# Patient Record
Sex: Female | Born: 1945 | Race: White | Hispanic: No | Smoking: Former smoker
Health system: Southern US, Community
[De-identification: ages and names within clinical notes are randomized; demographics above are authoritative.]

## PROBLEM LIST (undated history)

## (undated) DIAGNOSIS — M1990 Unspecified osteoarthritis, unspecified site: Secondary | ICD-10-CM

## (undated) DIAGNOSIS — I10 Essential (primary) hypertension: Secondary | ICD-10-CM

## (undated) HISTORY — PX: APPENDECTOMY: SHX54

## (undated) HISTORY — PX: HERNIA REPAIR: SHX51

## (undated) HISTORY — PX: COLON SURGERY: SHX602

## (undated) HISTORY — PX: ABDOMINAL HYSTERECTOMY: SHX81

## (undated) HISTORY — PX: OTHER SURGICAL HISTORY: SHX169

---

## 1999-09-08 ENCOUNTER — Encounter: Admission: RE | Admit: 1999-09-08 | Discharge: 1999-09-08 | Payer: Self-pay | Admitting: Family Medicine

## 2000-06-28 ENCOUNTER — Encounter: Admission: RE | Admit: 2000-06-28 | Discharge: 2000-06-28 | Payer: Self-pay | Admitting: Family Medicine

## 2000-06-28 ENCOUNTER — Encounter: Payer: Self-pay | Admitting: Family Medicine

## 2001-07-18 ENCOUNTER — Encounter: Admission: RE | Admit: 2001-07-18 | Discharge: 2001-07-18 | Payer: Self-pay | Admitting: Family Medicine

## 2001-07-18 ENCOUNTER — Encounter: Payer: Self-pay | Admitting: Family Medicine

## 2002-07-27 ENCOUNTER — Encounter: Payer: Self-pay | Admitting: Family Medicine

## 2002-07-27 ENCOUNTER — Encounter: Admission: RE | Admit: 2002-07-27 | Discharge: 2002-07-27 | Payer: Self-pay | Admitting: Family Medicine

## 2003-08-08 ENCOUNTER — Encounter: Payer: Self-pay | Admitting: Family Medicine

## 2003-08-08 ENCOUNTER — Encounter: Admission: RE | Admit: 2003-08-08 | Discharge: 2003-08-08 | Payer: Self-pay | Admitting: Family Medicine

## 2003-09-19 ENCOUNTER — Ambulatory Visit (HOSPITAL_COMMUNITY): Admission: RE | Admit: 2003-09-19 | Discharge: 2003-09-19 | Payer: Self-pay | Admitting: Gastroenterology

## 2004-08-13 ENCOUNTER — Encounter: Admission: RE | Admit: 2004-08-13 | Discharge: 2004-08-13 | Payer: Self-pay | Admitting: Family Medicine

## 2004-10-30 ENCOUNTER — Other Ambulatory Visit: Admission: RE | Admit: 2004-10-30 | Discharge: 2004-10-30 | Payer: Self-pay | Admitting: Family Medicine

## 2004-11-27 ENCOUNTER — Encounter: Admission: RE | Admit: 2004-11-27 | Discharge: 2004-11-27 | Payer: Self-pay | Admitting: Family Medicine

## 2005-01-26 ENCOUNTER — Other Ambulatory Visit: Admission: RE | Admit: 2005-01-26 | Discharge: 2005-01-26 | Payer: Self-pay | Admitting: Obstetrics and Gynecology

## 2005-08-18 ENCOUNTER — Encounter: Admission: RE | Admit: 2005-08-18 | Discharge: 2005-08-18 | Payer: Self-pay | Admitting: Family Medicine

## 2005-12-18 ENCOUNTER — Other Ambulatory Visit: Admission: RE | Admit: 2005-12-18 | Discharge: 2005-12-18 | Payer: Self-pay | Admitting: Obstetrics and Gynecology

## 2006-06-01 ENCOUNTER — Encounter (INDEPENDENT_AMBULATORY_CARE_PROVIDER_SITE_OTHER): Payer: Self-pay | Admitting: *Deleted

## 2006-06-01 ENCOUNTER — Ambulatory Visit (HOSPITAL_COMMUNITY): Admission: RE | Admit: 2006-06-01 | Discharge: 2006-06-02 | Payer: Self-pay | Admitting: Obstetrics and Gynecology

## 2006-06-04 ENCOUNTER — Inpatient Hospital Stay (HOSPITAL_COMMUNITY): Admission: EM | Admit: 2006-06-04 | Discharge: 2006-06-11 | Payer: Self-pay | Admitting: Surgery

## 2006-06-04 ENCOUNTER — Encounter (INDEPENDENT_AMBULATORY_CARE_PROVIDER_SITE_OTHER): Payer: Self-pay | Admitting: *Deleted

## 2006-06-04 ENCOUNTER — Encounter: Payer: Self-pay | Admitting: Obstetrics and Gynecology

## 2006-08-27 ENCOUNTER — Encounter: Admission: RE | Admit: 2006-08-27 | Discharge: 2006-08-27 | Payer: Self-pay | Admitting: Family Medicine

## 2006-12-21 ENCOUNTER — Encounter: Admission: RE | Admit: 2006-12-21 | Discharge: 2006-12-21 | Payer: Self-pay | Admitting: Obstetrics & Gynecology

## 2007-02-07 ENCOUNTER — Encounter: Admission: RE | Admit: 2007-02-07 | Discharge: 2007-02-07 | Payer: Self-pay | Admitting: Family Medicine

## 2007-08-30 ENCOUNTER — Encounter: Admission: RE | Admit: 2007-08-30 | Discharge: 2007-08-30 | Payer: Self-pay | Admitting: Family Medicine

## 2008-08-31 ENCOUNTER — Encounter: Admission: RE | Admit: 2008-08-31 | Discharge: 2008-08-31 | Payer: Self-pay | Admitting: Family Medicine

## 2009-07-22 ENCOUNTER — Encounter: Admission: RE | Admit: 2009-07-22 | Discharge: 2009-07-22 | Payer: Self-pay | Admitting: Family Medicine

## 2009-08-21 ENCOUNTER — Encounter: Admission: RE | Admit: 2009-08-21 | Discharge: 2009-08-21 | Payer: Self-pay | Admitting: General Surgery

## 2009-08-26 ENCOUNTER — Ambulatory Visit (HOSPITAL_BASED_OUTPATIENT_CLINIC_OR_DEPARTMENT_OTHER): Admission: RE | Admit: 2009-08-26 | Discharge: 2009-08-26 | Payer: Self-pay | Admitting: General Surgery

## 2009-09-09 ENCOUNTER — Encounter: Admission: RE | Admit: 2009-09-09 | Discharge: 2009-09-09 | Payer: Self-pay | Admitting: Family Medicine

## 2010-09-10 ENCOUNTER — Encounter: Admission: RE | Admit: 2010-09-10 | Discharge: 2010-09-10 | Payer: Self-pay | Admitting: Family Medicine

## 2011-02-27 LAB — CBC
HCT: 40.9 % (ref 36.0–46.0)
Hemoglobin: 13.7 g/dL (ref 12.0–15.0)
MCV: 89.7 fL (ref 78.0–100.0)
RDW: 13.9 % (ref 11.5–15.5)

## 2011-02-27 LAB — DIFFERENTIAL
Lymphs Abs: 1.2 10*3/uL (ref 0.7–4.0)
Neutro Abs: 4.4 10*3/uL (ref 1.7–7.7)
Neutrophils Relative %: 71 % (ref 43–77)

## 2011-02-27 LAB — BASIC METABOLIC PANEL
Calcium: 9.4 mg/dL (ref 8.4–10.5)
Chloride: 106 mEq/L (ref 96–112)
GFR calc Af Amer: 55 mL/min — ABNORMAL LOW (ref >60)
Sodium: 141 mEq/L (ref 135–145)

## 2011-04-10 NOTE — Discharge Summary (Signed)
NAMESOMER, TROTTER                ACCOUNT NO.:  [id number]   MEDICAL RECORD NO.:  [id number]          PATIENT TYPE:  OIB   LOCATION:  9312                          FACILITY:  [facility]   PHYSICIAN:  Cynthia P. Romine, M.D.DATE OF BIRTH:  [birth year]   DATE OF ADMISSION:  06/01/2006  DATE OF DISCHARGE:  06/02/2006                                 DISCHARGE SUMMARY   DISCHARGE DIAGNOSES:  1. Leiomyoma.  2. Uterine prolapse.  3. Cystocele.   HISTORY:  This is a 65 year old, divorced, white female, gravida 3, para 2  with uterovaginal prolapse. This has been managed previously with a pessary  but the patient was very very uncomfortable with the changing of her pessary  and therefore elected definitive surgical treatment. On June 01, 2006, she  underwent a total vaginal hysterectomy. Estimated blood loss was 50 mL and  there were no complications. She also underwent a Perigee anterior repair  with mesh and a pubovaginal sling by Dr. Eudelia Bunch and likewise there were  no complications. Postoperatively, she did very well and on postoperative  day #1, she was afebrile and in satisfactory condition for discharge. She  was given a prescription for Darvocet-N 100 #25 to take 1 p.o. q.4h. p.r.n.  pain. She was to followup with me in 1 month and with Dr. Logan Bores as he  directed. Pathology report did show 2 intramural leiomyomata 0.5 and 0.7 cm.  The lab showed admission H&H was 13.9 and 41.5, on discharge 10.2 and 29.2.  Chemistries were normal.           ______________________________  Edwena Felty. Romine, M.D.     CPR/MEDQ  D:  07/19/2006  T:  07/20/2006  Job:  [id number]   cc:   Jamison Neighbor, M.D.  Fax: [phone]

## 2011-04-10 NOTE — Op Note (Signed)
   NAME:  [name], [name]                          ACCOUNT NO.:  [id number]   MEDICAL RECORD NO.:  [id number]                   PATIENT TYPE:  AMB   LOCATION:  ENDO                                 FACILITY:  [facility]   PHYSICIAN:  Graylin Shiver, M.D.                DATE OF BIRTH:  1946/01/16   DATE OF PROCEDURE:  09/19/2003  DATE OF DISCHARGE:                                 OPERATIVE REPORT   PROCEDURE:  Colonoscopy.   ENDOSCOPIST:  Graylin Shiver, M.D.   INDICATION:  Screening.   INFORMED CONSENT:  Informed consent was obtained after explanation of the  risks of bleeding, infection and perforation.   PREMEDICATIONS:  Fentanyl 70 mcg IV, Versed 7 mg IV.   PROCEDURE:  With the patient in the left lateral decubitus position, a  rectal exam was performed and no masses were felt.  The Olympus pediatric  adjustable colonoscope was inserted into the rectum and advanced around an  extremely tortuous colon to the cecum.  Cecal landmarks were identified.  The cecum and ascending colon were normal.  The transverse colon was normal.  The descending colon and sigmoid revealed diverticulosis.  The rectum was  normal.  She tolerated the procedure well without complications.   IMPRESSION:  Diverticulosis.                                               Graylin Shiver, M.D.    SFG/MEDQ  D:  09/19/2003  T:  09/19/2003  Job:  [id number]   cc:   Duncan Dull, M.D.  [address]  [place]  [place] [address]  Fax: [phone]

## 2011-04-10 NOTE — H&P (Signed)
[name], HELMING                ACCOUNT NO.:  [id number]   MEDICAL RECORD NO.:  [id number]          PATIENT TYPE:  INP   LOCATION:  2550                         FACILITY:  [facility]   PHYSICIAN:  Sandria Bales. Ezzard Standing, M.D.  DATE OF BIRTH:  [birth year]   DATE OF ADMISSION:  06/04/2006  DATE OF DISCHARGE:                                HISTORY & PHYSICAL   HISTORY OF ILLNESS:  This is a 65 year old white female who was in fairly  good health who underwent a vaginal hysterectomy and bladder tack by Dr.  Purvis Sheffield and Dr. Eudelia Bunch on 06/01/2006.  She complained of some early  pain in her right groin afterwards, was sent home though the day after her  surgery.  She gives a history of having a right femoral hernia which she has  not had surgerically treated before nor has she been made aware to Dr.  Tresa Res and Dr. Logan Bores.   She presented to [facility] today with acute onset of increasing lower  abdominal pain and a CT scan reviewed revealed a pneumoperitoneum.   PRIOR GASTROINTESTINAL HISTORY:  She had a perforated ulcer in 1985 which  required a laparotomy.  She has had no history of liver disease, pancreas  disease or colon disease.  She has even a more remote history of an  appendectomy in 1948.  She was having her hysterectomy because of prolapse  of her uterus.   PAST MEDICAL HISTORY:  She is ALLERGIC TO CODEINE.   MEDICATIONS:  Include atenolol, Cardura, doxycycline.   REVIEW OF SYSTEMS:  NEUROLOGIC:  No seizure or loss of consciousness.  CARDIAC:  She has had hypertension for a number of years but no cardiac  catheterization or chest pain.  PULMONARY:  No history of pneumonia or tuberculosis.  GASTROINTESTINAL:  See history of present illness.  UROLOGIC:  No kidney infections. She still had the foley from Dr. Logan Bores  bladder tacking surgery.   She is divorced.  She has two sons, Whitney Post and Weston Brass.   PHYSICAL EXAMINATION:  Temperature is 97.7, pulse 79, blood pressure  150/70,  sats are 97%.  She is a well-nourished, somewhat pale, uncomfortable white  female.  HEENT:  Unremarkable.  NECK:  Supple, I feel no mass, no thyromegaly.  LUNGS:  Clear to auscultation.  HEART:  Regular rate and rhythm.  ABDOMEN:  Diffusely tender.  In a totally supine position, I may be can  appreciate the right inguinal hernia.  She has a bruise in her pubic area  from her recent surgery.  She has a well-healed upper midline incision, a  well-healed right paramedian incision.  I did not do a pelvic exam on her.   LABORATORY:  White blood count of 13,500, hemoglobin 12, hematocrit 37.  I reviewed her abdominal CT scan with Arbie Cookey and at first he was actually  more worried about this being possibly origin originating from the stomach  in the way the contrast is and the free air.  He also identified a right  renal cyst which appears benign, about a 2-cm left adrenal mass which  would  be an incidental finding and a right inguinal hernia where it is a little  bit hard to tell whether she has bowel in that hernia or not, and it looks  more like a femoral hernia.   DIAGNOSES:  1.  Pneumoperitoneum after recent surgery with a differential diagnosis      being injury secondary to prior surgery versus ulcer disease versus      diverticular disease.  I talked with the patient and her son Whitney Post.  I      think she is best to proceed with an exploratory laparotomy.  I      discussed with them the indications and potential risks of surgery.      Potential risks include but are not limited to bleeding, infection which      I think she already has, the need for bowel resection and the      possibility of a colostomy.  I spoke to both Drs. Romine and Evans about      my plan.  2.  History  of ulcer disease.  I do not know how this plays a role in this      current finding.  3.  Right femoral hernia.  Again, I am not sure how this plays a role in      this current finding.  4.  Right  renal cyst.  5.  Left adrenal mass.  6.  Hypertension.      Sandria Bales. Ezzard Standing, M.D.  Electronically Signed     DHN/MEDQ  D:  06/04/2006  T:  06/04/2006  Job:  [id number]   cc:   Duncan Dull, M.D.  Fax: [phone]   Edwena Felty. Romine, M.D.  Fax: [phone]   Jamison Neighbor, M.D.  Fax: [phone]

## 2011-04-10 NOTE — Op Note (Signed)
[name], [name]                ACCOUNT NO.:  [id number]   MEDICAL RECORD NO.:  [id number]          PATIENT TYPE:  AMB   LOCATION:  SDC                           [facility]   PHYSICIAN:  Cynthia P. Romine, M.D.DATE OF BIRTH:  06-06-1946   DATE OF PROCEDURE:  06/01/2006  DATE OF DISCHARGE:                                 OPERATIVE REPORT   PREOPERATIVE DIAGNOSIS:  Uterovaginal prolapse.   POSTOPERATIVE DIAGNOSIS:  Uterovaginal prolapse, path pending.   PROCEDURE:  Total vaginal hysterectomy.   SURGEON:  Dr. Arline Asp Romine.   ASSISTANT:  Dr. Leda Quail.   ANESTHESIA:  General by LMA.   ESTIMATED BLOOD LOSS:  50 mL.   COMPLICATIONS:  None.   PROCEDURE:  The patient was taken to the operating room and after induction  of adequate general anesthesia was placed in dorsal lithotomy position and  prepped and draped in usual fashion.  Her pessary was removed.  The bladder  was drained with a red rubber catheter.  The vagina was reprepped after  removing the pessary.  A posterior weighted and anterior Deaver were placed.  The cervix was grasped at the anterior lip with a straight Jacobs tenaculum.  The mucosa over the cervix was infiltrated with 3 mL of 1% Xylocaine with  epinephrine.  The mucosa was incised with a knife from 9 to 3 o'clock and  taken down sharply off the cervix.  Attention was next turned posteriorly.  A posterior colpotomy incision was made and the banana retractor was placed.  Uterosacral ligaments were clamped, cut and tied with 0 Vicryl.  The  cardinal ligaments on each side were clamped, cut and tied again with 0  Vicryl.  Attention was then turned anteriorly.  The peritoneum was elevated  and entered atraumatically.  The Deaver was placed in the peritoneal space.  The pedicle containing the uterine arteries were clamped, cut and doubly  tied on both sides.  The fundus and delivered to the posterior cul-de-sac.  The pedicle containing the tube,  utero-ovarian and round, was clamped, cut  and doubly tied on both sides.  The specimen was sent to pathology.  The  ovaries were inspected and felt to be normal as were the tubes.  The  pedicles were hemostatic and they were cut, allowing the ovaries to retract.  The vaginal cuff was run with 0 Vicryl with a locking suture from 1 o'clock  to 11 o'clock for hemostasis.  An enterocele stitch was placed by going into  the posterior peritoneum to the vaginal cuff suturing the left uterosacral  ligament __________ across the peritoneum between the right uterosacral  ligament coming out in the midline and tying the suture.  The wound was then  irrigated and inspected and found to be  hemostatic.  The vaginal cuff was closed with three sutures of figure-of-  eight 0 Vicryl.  The cuff was then irrigated again and the instruments were  removed from the vagina and the procedure was terminated.  Dr. Logan Bores then  came in to do the anterior repair and he will dictate that separately.  ______________________________  Edwena Felty. Romine, M.D.     CPR/MEDQ  D:  06/01/2006  T:  06/01/2006  Job:  [id number]

## 2011-04-10 NOTE — Op Note (Signed)
[name], [name]                ACCOUNT NO.:  [id number]   MEDICAL RECORD NO.:  [id number]          PATIENT TYPE:  OIB   LOCATION:  9312                          [facility]   PHYSICIAN:  Jamison Neighbor, M.D.  DATE OF BIRTH:  January 17, 1946   DATE OF PROCEDURE:  06/01/2006  DATE OF DISCHARGE:  06/02/2006                                 OPERATIVE REPORT   PREOPERATIVE DIAGNOSES:  1. Cystocele.  2. Stress urinary incontinence.   PROCEDURE:  Perigee anterior repair with mesh, and also a pubovaginal sling.   SURGEON:  Jamison Neighbor, M.D.   ANESTHESIA:  General.   COMPLICATIONS:  None.   DRAINS:  Foley catheter.   BRIEF HISTORY:  This 65 year old female has known vaginal prolapse as well  as a cystocele and associated stress incontinence.  The patient underwent a  vaginal hysterectomy by Dr. Meredeth Gallagher with the assistance of Dr.  Leda Gallagher, and the uterus has been removed.  The patient had very  minimal blood loss and appeared to have done very nicely with that  procedure.  She is now to undergo repair of her stress incontinence along  with mesh placement for repair of her cystocele.  She understands the risks  and benefits of the procedure and gave full informed consent.   PROCEDURE:  The patient was found in the operating room with the area fully  prepped, having completed her vaginal hysterectomy.  The incision was  extended from the midportion of the urethra all the way down to the  operative site where the hysterectomy had been performed.  Flaps were raised  bilaterally, exposing the cystocele.  This went all the way back to but not  through the endopelvic fascia.  The cystocele was plicated in the midline,  pulling the cervical fascia across to elevate the cystocele in preparation  for placement of the Perigee sling.  The patient had two puncture holes  placed on each side.  One was at the approximate level of the clitoris in  the groin crease, and the other  was approximately 3.5 cm inferiorly.  A  curved needle was then passed from the incision into the vaginal incision on  both sides for a total of 4 needles.  The 4 arms of the Perigee sling were  then pulled through after cystoscopy to confirm that none of the needles had  penetrated the bladder.  Surgical confirmation was performed to make sure  that none of the needles had penetrated the lateral sulcus.  The mesh graft  was then placed in the appropriate position and trimmed in size.  It was  used to elevate the cystocele very nicely, and a good neutral position was  obtained.  The redundant vaginal mucosa was partially trimmed.  A sling was  then placed around the mid-urethra, passing down from 2 small stab incisions  superiorly, around the urethra and up to the top.  The position the sling  was confirmed to be correct by cystoscopy, which did confirm there was no  injury to the urethra.  The sling tension was then  set so that there was no  overcorrection.  This was done by placing a Mayo scissors between the  urethra and the sling.  All 6 arms of the mesh were then trimmed off at the  skin level.  The incision was closed with a running suture of 2-0 Vicryl  after adequate irrigation.  Packing was placed.  The patient had a Foley  catheter  inserted.  She tolerated the procedure well and was taken to the recovery  room in good condition.  She will have overnight observation due to the fact  that she has had a hysterectomy as well.  The patient tolerated the  procedure well and was taken to the recovery room in good condition.           ______________________________  Jamison Neighbor, M.D.  Electronically Signed     RJE/MEDQ  D:  07/10/2006  T:  07/11/2006  Job:  [id number]   cc:   Aram Beecham P. Romine, M.D.  Fax: [phone]

## 2011-04-10 NOTE — Op Note (Signed)
NAMEQUINETTE, HENTGES                ACCOUNT NO.:  [id number]   MEDICAL RECORD NO.:  [id number]          PATIENT TYPE:  INP   LOCATION:  5735                         FACILITY:  [facility]   PHYSICIAN:  Sandria Bales. Ezzard Standing, M.D.  DATE OF BIRTH:  21-May-1946   DATE OF PROCEDURE:  06/04/2006  DATE OF DISCHARGE:                                 OPERATIVE REPORT   PREOPERATIVE DIAGNOSIS:  Pneumoperitoneum.  Right inguinal/femoral hernia.  Recent pelvic/bladder surgery.   POSTOPERATIVE DIAGNOSIS:  Pneumoperitoneum secondary to perforated posterior  pyloric channel ulcer, right femoral hernia, no evidence of bowel injury  from recent pelvic/bladder surgery.   PROCEDURE:  [name] repair of perforated posterior pyloric channel  ulcer and pursestring repair of right femoral hernia.   SURGEON:  Sandria Bales. Ezzard Standing, M.D.   ASSISTANT:  Jamison Neighbor, M.D.   ANESTHESIA:  General endotracheal.   ESTIMATED BLOOD LOSS:  100 mL.   DRAINS:  None.  I did place Telfa wicks in the wound.   INDICATIONS:  Miss Komatsu is a 65 year old white female who is a patient of  Dr. Shaune Pollack who presented 3 days after vaginal hysterectomy and bladder  tacked by Dr. Salena Saner. Romine and Dr. Logan Bores, with acute onset of abdominal pain.  She initially presented to [facility] where CT scan showed  pneumoperitoneum.  She was transferred to the [facility] for management  of his pneumoperitoneum.   The indications, procedure complications and procedure explained to the  patient and her son by phone.  Her pneumoperitoneum is possibly related to  recent surgery versus remote history of peptic ulcer disease versus  diverticular disease.  She referred for bowel resection and colostomy and  possibly I am not going to be able to close her repaired hernia.   DESCRIPTION OF PROCEDURE:  The patient in the supine position given 100 mg  of Tygacil at the beginning of the operation.  She was already on  doxycycline.  She had a Foley  catheter ws already in place, PAS stockings in  place.  Her abdomen is prepped with Betadine solution and sterilely draped.   Sharp dissection was carried down to the abdominal cavity first through an  infraumbilical incision.  She had a prior upper midline incision for prior  perforate ulcer and a prior right paramedian incision from an appendectomy.  The patient clearly had some contamination in her belly.  She had a hernia,  I think a right femoral hernia but mainly it looks like she had omentum in  this hernia, not bowel.  The sigmoid colon was examined. There was no  evidence of injury.  The cecum was examined, there was no evidence of  injury.  The small-bowel was run back to the terminal ileum towards the  ligament of Treitz and there is no ev of small-bowel injury.   On looking higher in the abdomen, I took down the prior adhesions to her  midline incision or her appendectomy incision, and it was clear that the  source was upper abdomen therefore I extended her incision above her  umbilicus and exposed  her stomach, and on the posterior wall of the stomach,  right up the pylorus was an approximately 1 cm perforated pyloric channel  ulcer. I took down the adhesions around this and cleaned this up and then I  placed 3 silk sutures to tie these down as a primary repair and then did an  omental patch over this.   I then irrigated the abdomen with about 6 liters of saline trying to make  sure all quadrants were clear.   I did turn my attention to the right femoral hernia where I reduced this  omentum which was incarcerated in the hernia.  I then made a pursestring  with #1 Novafil suture which closed this off which I think prevented  anything else from slipping in this, so I am not really sure this is a very  good hernia repair.   The abdomen was again then closed with 2 running #1 PDS sutures with some  interrupted #1 Novafil sutures.  The skin was closed with Telfa placed in  the  wound.   The patient was transferred to the recovery room.  We plan to keep her in  the ICU overnight.  Her sponge and needle count were correct at the end of  the case.  She tolerated the procedure well.  Had minimal blood loss  probably less than 100 mL.  Sponge and needle count were correct.      Sandria Bales. Ezzard Standing, M.D.  Electronically Signed     DHN/MEDQ  D:  06/04/2006  T:  06/05/2006  Job:  [id number]   cc:   Jamison Neighbor, M.D.  Fax: [phone]   Duncan Dull, M.D.  Fax: [phone]   Edwena Felty. Romine, M.D.  Fax: [phone]

## 2011-04-10 NOTE — Discharge Summary (Signed)
[name], [name]                ACCOUNT NO.:  [id number]   MEDICAL RECORD NO.:  [id number]          PATIENT TYPE:  INP   LOCATION:  5735                         FACILITY:  [facility]   PHYSICIAN:  Leonie Gallagher, M.D.   DATE OF BIRTH:  1945-12-29   DATE OF ADMISSION:  06/04/2006  DATE OF DISCHARGE:  06/11/2006                                 DISCHARGE SUMMARY   CHIEF COMPLAINT/REASON FOR ADMISSION:  [name] is a 65 year old female  patient normally in good health who underwent vaginal hysterectomy and  bladder tack at [name] on June 01, 2006.  She complained of pain  in the right groin after the procedure and was sent home the day after her  surgery.  She reported to Dr. Ezzard Gallagher a history of right femoral hernia,  which she has not had surgically treated nor had she made the operative  physicians at [name] aware of this problem as well.  She presented  back to [name] with complaints of acute increasing lower abdominal  pain.  A CT scan done there showed a pneumoperitoneum.   On exam, patient's vital signs were stable.  She was mildly hypertensive.  Her abdomen was diffusely tender.  Dr. Ezzard Gallagher felt he could, maybe, feel a  right inguinal hernia, but was difficult due to swelling and bruising from  recent surgery.  Her midline incision was well healed.  Pelvic exam was  deferred.  Her white blood cell count was 13,500, hemoglobin 12, hematocrit  37.  The CT scan was reviewed with the radiologist by Dr. Ezzard Gallagher.  There  were several nonspecific findings that would not be causing the patient's  pain.  These are noted on the history and physical under laboratory.  No  definite point on CT to define where the pneumoperitoneum was arriving from.  The differential diagnosis was injury secondary to first surgery versus  ulcer disease, versus diverticular disease.  Based on these findings, Dr.  Ezzard Gallagher felt that the patient needed to be admitted and undergo  exploratory  laparotomy to determine the etiology of the pneumoperitoneum.   ADMISSION DIAGNOSES:  1. Pneumoperitoneum.  2. History of ulcer disease.  3. Right femoral hernia.  4. Right renal cyst.  5. Left adrenal mass.  6. Hypertension.   Gallagher COURSE:  The patient was taken urgently to the operating room by  Dr. Ezzard Gallagher where she was found to have a perforated pyloric channel ulcer.  Also right femoral hernia.  There was no noted bowel injury from prior  surgery.  Patient underwent a [name] patch placement to the perforated ulcer  and a pursestring repair to the right femoral hernia.  This was done under  general anesthesia.  The patient was taken to the PACU for recovery in the  initial postoperative period and then later back to the general floor.  Gallagher course over the next few days, the patient slowly progressed.  Her  white count was down to 10,500 postoperative day #1, hemoglobin was stable  at 11.  Potassium slightly low at 3.3.  Patient had issues related to  nausea.  Her NG tube remained in place.  Sodium, on postop day #2 was 130,  white count back up to 12,000.  Abdomen exam was benign.   Throughout the remainder of the hospitalization, patient's main troubles  related to nausea and vomiting, it appeared that the NG had not been placed  appropriately so the NG was repositioned.  The patient continued without  flatus for several days, but NG output improved after repositioning of the  NG tube.  By postop day #5 patient was complaining of belching and flatus.  Her white count was normal at 9,600, hemoglobin 10.8.  She had decreased  incisional pain and wanted her NG tube out.  Her NG tube was clamped with  plans to discontinue later in the day if she tolerated without nausea,  vomiting, or abdominal distention.  The NG tube was discontinued on postop  day #5 and, by postop day #6, patient was started on clear liquid diet.  Postop day #7, patient remained stable, was  tolerating a diet advance.  During the first surgery, with the bladder tacking, a Foley catheter had  been placed.  Urology came by prior to discharge to evaluate the patient,  discontinued the Foley, and patient was taught I and O catheterization and  was deemed appropriate, from their standpoint, for discharge home.  From our  standpoint, she was also stable for discharge home with the plan to follow  up with the operative surgeon, Dr. Ezzard Gallagher, in 2 weeks.   FINAL DISCHARGE DIAGNOSES:  1. Perforated duodenum ulcer status post [name] patch.  2. Right inguinal hernia status post pursestring repair.  3. Recent vaginal hysterectomy and bladder tacking, surgery by Dr. Purvis Sheffield and Dr. Eudelia Bunch on June 01, 2006.  4. Leukocytosis, resolved.  5. Mild anemia.  6. Hypokalemia, resolved.   DISCHARGE MEDICATIONS:  1. The patient will resume any preoperative medications she was taking.  2. [name] p.r.n. for pain.   DIET:  Bland diet.   ACTIVITY:  No lifting for 6 weeks.  May shower.   FOLLOWUP:  She needs to call Dr. Allene Gallagher office to schedule an appointment  for the next few weeks.      [name], N.P.      Leonie Gallagher, M.D.  Electronically Signed    ALE/MEDQ  D:  07/09/2006  T:  07/10/2006  Job:  [id number]   cc:   [name], M.D.  [name], M.D.  [name], M.D.  [name], M.D.

## 2011-07-29 ENCOUNTER — Other Ambulatory Visit: Payer: Self-pay | Admitting: Dermatology

## 2011-08-06 ENCOUNTER — Encounter (HOSPITAL_BASED_OUTPATIENT_CLINIC_OR_DEPARTMENT_OTHER)
Admission: RE | Admit: 2011-08-06 | Discharge: 2011-08-06 | Disposition: A | Discharge status: Home / Self Care | Payer: Medicare Other | Source: Ambulatory Visit | Attending: Orthopedic Surgery | Admitting: Orthopedic Surgery

## 2011-08-06 LAB — BASIC METABOLIC PANEL
CO2: 26 mEq/L (ref 19–32)
Calcium: 9.4 mg/dL (ref 8.4–10.5)
Creatinine, Ser: 1.12 mg/dL — ABNORMAL HIGH (ref 0.50–1.10)
GFR calc Af Amer: 59 mL/min — ABNORMAL LOW (ref >60)
GFR calc non Af Amer: 49 mL/min — ABNORMAL LOW (ref >60)
Glucose, Bld: 100 mg/dL — ABNORMAL HIGH (ref 70–99)
Sodium: 135 mEq/L (ref 135–145)

## 2011-08-07 ENCOUNTER — Ambulatory Visit (HOSPITAL_BASED_OUTPATIENT_CLINIC_OR_DEPARTMENT_OTHER)
Admission: RE | Admit: 2011-08-07 | Discharge: 2011-08-07 | Disposition: A | Discharge status: Home / Self Care | Payer: Medicare Other | Source: Ambulatory Visit | Attending: Orthopedic Surgery | Admitting: Orthopedic Surgery

## 2011-08-07 DIAGNOSIS — Z01810 Encounter for preprocedural cardiovascular examination: Secondary | ICD-10-CM | POA: Insufficient documentation

## 2011-08-07 DIAGNOSIS — M6530 Trigger finger, unspecified finger: Secondary | ICD-10-CM | POA: Insufficient documentation

## 2011-08-07 DIAGNOSIS — M65849 Other synovitis and tenosynovitis, unspecified hand: Secondary | ICD-10-CM | POA: Insufficient documentation

## 2011-08-07 DIAGNOSIS — G5600 Carpal tunnel syndrome, unspecified upper limb: Secondary | ICD-10-CM | POA: Insufficient documentation

## 2011-08-07 DIAGNOSIS — M65839 Other synovitis and tenosynovitis, unspecified forearm: Secondary | ICD-10-CM | POA: Insufficient documentation

## 2011-08-07 DIAGNOSIS — Z01812 Encounter for preprocedural laboratory examination: Secondary | ICD-10-CM | POA: Insufficient documentation

## 2011-08-07 LAB — POCT HEMOGLOBIN-HEMACUE: Hemoglobin: 13 g/dL (ref 12.0–15.0)

## 2011-08-13 ENCOUNTER — Other Ambulatory Visit: Payer: Self-pay | Admitting: Family Medicine

## 2011-08-13 DIAGNOSIS — Z1231 Encounter for screening mammogram for malignant neoplasm of breast: Secondary | ICD-10-CM

## 2011-08-13 NOTE — Op Note (Signed)
NAMEJESSAMY, [name]                ACCOUNT NO.:  [id number]  MEDICAL RECORD NO.:  [id number]  LOCATION:                                 FACILITY:  PHYSICIAN:  Lisa Fitch. Kayliana Codd, M.D.      DATE OF BIRTH:  DATE OF PROCEDURE:  08/07/2011 DATE OF DISCHARGE:                              OPERATIVE REPORT   PREOPERATIVE DIAGNOSES: 1. Entrapment neuropathy, median nerve, right carpal tunnel. 2. 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  POSTOPERATIVE DIAGNOSES: 1. Entrapment neuropathy, median nerve, right carpal tunnel. 2. 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  OPERATION: 1. Release of right thumb A1 pulley. 2. Release of right transcarpal ligament.  OPERATING SURGEON:  Lisa Fitch. Sajjad Honea, MD.  ASSISTANT:  Marveen Reeks Dasnoit, PA.  ANESTHESIA:  General by LMA.  SUPERVISING ANESTHESIOLOGIST:  Zenon Mayo, MD.  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  Clinical examination revealed signs of bilateral carpal tunnel syndrome and stenosing tenosynovitis involving the right thumb with a swollen A1 pulley that was markedly tender on direct palpation.  We advised her to proceed with release of the A1 pulley and after electrodiagnostic studies confirmed significant median neuropathy, release of the right transverse carpal ligament.  Questions regarding the surgery were invited and answered in detail.  DESCRIPTION OF PROCEDURE:  [name] was brought to room #1 of the [facility] and placed in supine position on the operating table.  Following induction of general anesthesia by LMA technique under Dr. Jarrett Ables direct supervision, the right arm was prepped with Betadine soap solution and sterilely draped.  Following routine surgical time- out, the right arm and hand were exsanguinated with an Esmarch  bandage and an arterial tourniquet on the proximal right brachium inflated to 220 mmHg.  Procedure commenced with short transverse incision directly over the thumb A1 pulley.  Subcutaneous tissues were carefully divided taking care to gently retract neurovascular structures.  The pulley was split with scalpel and scissors.  This freed the tendon.  The tendon was noted to have some mild areas of necrosis due to chronic pressure.  This was debrided with a rongeur.  The wound was then repaired with intradermal 3-0 Prolene suture.  Attention was then directed to the proximal palm.  A short incision was fashioned in the line of the ring finger.  Subcutaneous tissues were carefully divided in the middle palmar fascia.  This was split longitudinally to the common sensory branches of the median nerve.  These were followed back to the median nerve proper which was isolated from the transverse carpal ligament with a Insurance risk surveyor.  Once a pathway was cleared superficial and deep to the transverse carpal ligament, the ligament was released with scissors into the distal forearm.  This widely opened carpal canal.  No mass or other predicaments were noted.  Bleeding points along the margin of the released ligament were electrocauterized with bipolar current.  The wound was repaired with intradermal 3-0 Prolene suture.  2% lidocaine was infiltrated for postop analgesia.  She will return  for follow up in 7-9 days.  Questions invited and answered in detail.     [name], M.D.     RVS/MEDQ  D:  08/07/2011  T:  08/07/2011  Job:  [id number]  cc:   Duncan Dull, M.D.  Electronically Signed by Josephine Igo M.D. on 08/13/2011 11:39:11 AM

## 2011-09-14 ENCOUNTER — Ambulatory Visit
Admission: RE | Admit: 2011-09-14 | Discharge: 2011-09-14 | Disposition: A | Discharge status: Home / Self Care | Payer: Medicare Other | Source: Ambulatory Visit | Attending: Family Medicine | Admitting: Family Medicine

## 2011-09-14 DIAGNOSIS — Z1231 Encounter for screening mammogram for malignant neoplasm of breast: Secondary | ICD-10-CM

## 2011-09-18 ENCOUNTER — Other Ambulatory Visit: Payer: Self-pay | Admitting: Family Medicine

## 2011-09-18 DIAGNOSIS — R928 Other abnormal and inconclusive findings on diagnostic imaging of breast: Secondary | ICD-10-CM

## 2011-09-24 ENCOUNTER — Encounter (HOSPITAL_BASED_OUTPATIENT_CLINIC_OR_DEPARTMENT_OTHER): Payer: Self-pay | Admitting: *Deleted

## 2011-09-24 NOTE — Progress Notes (Signed)
Dr. Gelene Mink reviewed EKG and labs from Sept 14 th visit here - OK for surgery does not need more labs

## 2011-09-29 ENCOUNTER — Encounter (HOSPITAL_BASED_OUTPATIENT_CLINIC_OR_DEPARTMENT_OTHER)
Admission: RE | Disposition: A | Discharge status: Home / Self Care | Payer: Self-pay | Source: Ambulatory Visit | Attending: Orthopedic Surgery

## 2011-09-29 ENCOUNTER — Ambulatory Visit (HOSPITAL_BASED_OUTPATIENT_CLINIC_OR_DEPARTMENT_OTHER)
Admission: RE | Admit: 2011-09-29 | Discharge: 2011-09-29 | Disposition: A | Discharge status: Home / Self Care | Payer: Medicare Other | Source: Ambulatory Visit | Attending: Orthopedic Surgery | Admitting: Orthopedic Surgery

## 2011-09-29 ENCOUNTER — Encounter (HOSPITAL_BASED_OUTPATIENT_CLINIC_OR_DEPARTMENT_OTHER): Payer: Self-pay | Admitting: *Deleted

## 2011-09-29 ENCOUNTER — Ambulatory Visit (HOSPITAL_BASED_OUTPATIENT_CLINIC_OR_DEPARTMENT_OTHER): Payer: Medicare Other | Admitting: *Deleted

## 2011-09-29 DIAGNOSIS — Z01812 Encounter for preprocedural laboratory examination: Secondary | ICD-10-CM | POA: Insufficient documentation

## 2011-09-29 DIAGNOSIS — G5600 Carpal tunnel syndrome, unspecified upper limb: Secondary | ICD-10-CM | POA: Insufficient documentation

## 2011-09-29 DIAGNOSIS — M65839 Other synovitis and tenosynovitis, unspecified forearm: Secondary | ICD-10-CM | POA: Insufficient documentation

## 2011-09-29 DIAGNOSIS — I10 Essential (primary) hypertension: Secondary | ICD-10-CM | POA: Insufficient documentation

## 2011-09-29 DIAGNOSIS — M6530 Trigger finger, unspecified finger: Secondary | ICD-10-CM | POA: Insufficient documentation

## 2011-09-29 HISTORY — PX: TRIGGER FINGER RELEASE: SHX641

## 2011-09-29 HISTORY — PX: CARPAL TUNNEL RELEASE: SHX101

## 2011-09-29 HISTORY — DX: Essential (primary) hypertension: I10

## 2011-09-29 LAB — POCT HEMOGLOBIN-HEMACUE: Hemoglobin: 13.3 g/dL (ref 12.0–15.0)

## 2011-09-29 SURGERY — CARPAL TUNNEL RELEASE
Anesthesia: Choice | Site: Wrist | Laterality: Left

## 2011-09-29 MED ORDER — LACTATED RINGERS IV SOLN: INTRAVENOUS | Status: DC

## 2011-09-29 MED ORDER — MIDAZOLAM HCL 5 MG/5ML IJ SOLN
INTRAMUSCULAR | Status: DC | PRN
  Administered 2011-09-29: 0.5 mg via INTRAVENOUS

## 2011-09-29 MED ORDER — ONDANSETRON HCL 4 MG/2ML IJ SOLN
INTRAMUSCULAR | Status: DC | PRN
  Administered 2011-09-29: 4 mg via INTRAVENOUS

## 2011-09-29 MED ORDER — DEXAMETHASONE SODIUM PHOSPHATE 4 MG/ML IJ SOLN
INTRAMUSCULAR | Status: DC | PRN
  Administered 2011-09-29: 10 mg via INTRAVENOUS

## 2011-09-29 MED ORDER — LACTATED RINGERS IV SOLN
INTRAVENOUS | Status: DC | PRN
  Administered 2011-09-29 (×2): via INTRAVENOUS

## 2011-09-29 MED ORDER — LIDOCAINE HCL 2 % IJ SOLN
INTRAMUSCULAR | Status: DC | PRN
  Administered 2011-09-29: 7 mL

## 2011-09-29 MED ORDER — FENTANYL CITRATE 0.05 MG/ML IJ SOLN: 25.0000 ug | INTRAMUSCULAR | Status: DC | PRN

## 2011-09-29 MED ORDER — LIDOCAINE HCL (CARDIAC) 20 MG/ML IV SOLN
INTRAVENOUS | Status: DC | PRN
  Administered 2011-09-29: 60 mg via INTRAVENOUS

## 2011-09-29 MED ORDER — PROMETHAZINE HCL 25 MG/ML IJ SOLN: 6.2500 mg | INTRAMUSCULAR | Status: DC | PRN

## 2011-09-29 MED ORDER — FENTANYL CITRATE 0.05 MG/ML IJ SOLN
INTRAMUSCULAR | Status: DC | PRN
  Administered 2011-09-29: 50 ug via INTRAVENOUS

## 2011-09-29 MED ORDER — PROPOFOL 10 MG/ML IV EMUL
INTRAVENOUS | Status: DC | PRN
  Administered 2011-09-29: 140 mg via INTRAVENOUS

## 2011-09-29 SURGICAL SUPPLY — 50 items
BANDAGE ADHESIVE 1X3 (GAUZE/BANDAGES/DRESSINGS) IMPLANT
BANDAGE ELASTIC 3 VELCRO ST LF (GAUZE/BANDAGES/DRESSINGS) ×3 IMPLANT
BLADE SURG 15 STRL LF DISP TIS (BLADE) ×2 IMPLANT
BLADE SURG 15 STRL SS (BLADE) ×3
BNDG CMPR 9X4 STRL LF SNTH (GAUZE/BANDAGES/DRESSINGS)
BNDG CMPR MD 5X2 ELC HKLP STRL (GAUZE/BANDAGES/DRESSINGS) ×2
BNDG ELASTIC 2 VLCR STRL LF (GAUZE/BANDAGES/DRESSINGS) ×3 IMPLANT
BNDG ESMARK 4X9 LF (GAUZE/BANDAGES/DRESSINGS) IMPLANT
BRUSH SCRUB EZ PLAIN DRY (MISCELLANEOUS) ×3 IMPLANT
CLOTH BEACON ORANGE TIMEOUT ST (SAFETY) ×3 IMPLANT
CORDS BIPOLAR (ELECTRODE) ×3 IMPLANT
COVER MAYO STAND STRL (DRAPES) ×3 IMPLANT
COVER TABLE BACK 60X90 (DRAPES) ×3 IMPLANT
CUFF TOURNIQUET SINGLE 18IN (TOURNIQUET CUFF) ×3 IMPLANT
DECANTER SPIKE VIAL GLASS SM (MISCELLANEOUS) IMPLANT
DRAPE EXTREMITY T 121X128X90 (DRAPE) ×3 IMPLANT
DRAPE SURG 17X23 STRL (DRAPES) ×3 IMPLANT
DRSG XEROFORM 1X8 (GAUZE/BANDAGES/DRESSINGS) ×3 IMPLANT
GAUZE SPONGE 4X4 12PLY STRL LF (GAUZE/BANDAGES/DRESSINGS) ×6 IMPLANT
GAUZE XEROFORM 1X8 LF (GAUZE/BANDAGES/DRESSINGS) ×3 IMPLANT
GLOVE BIO SURGEON STRL SZ 6.5 (GLOVE) ×3 IMPLANT
GLOVE BIO SURGEON STRL SZ7 (GLOVE) ×3 IMPLANT
GLOVE BIOGEL M STRL SZ7.5 (GLOVE) ×3 IMPLANT
GLOVE ORTHO TXT STRL SZ7.5 (GLOVE) ×3 IMPLANT
GOWN PREVENTION PLUS XLARGE (GOWN DISPOSABLE) ×3 IMPLANT
GOWN PREVENTION PLUS XXLARGE (GOWN DISPOSABLE) ×6 IMPLANT
GOWN STRL REIN XL XLG (GOWN DISPOSABLE) ×6 IMPLANT
LABEL LIDOCAINE (MISCELLANEOUS) ×3 IMPLANT
NEEDLE 27GAX1X1/2 (NEEDLE) IMPLANT
PACK BASIN DAY SURGERY FS (CUSTOM PROCEDURE TRAY) ×3 IMPLANT
PAD CAST 3X4 CTTN HI CHSV (CAST SUPPLIES) ×2 IMPLANT
PAD CAST 4YDX4 CTTN HI CHSV (CAST SUPPLIES) ×2 IMPLANT
PADDING CAST ABS 4INX4YD NS (CAST SUPPLIES) ×1
PADDING CAST ABS COTTON 4X4 ST (CAST SUPPLIES) ×2 IMPLANT
PADDING CAST COTTON 3X4 STRL (CAST SUPPLIES) ×3
PADDING CAST COTTON 4X4 STRL (CAST SUPPLIES) ×3
PADDING WEBRIL 3 STERILE (GAUZE/BANDAGES/DRESSINGS) ×3 IMPLANT
SPLINT PLASTER 3X15 (CAST SUPPLIES) ×3 IMPLANT
SPLINT PLASTER CAST XFAST 3X15 (CAST SUPPLIES) ×10 IMPLANT
SPLINT PLASTER XTRA FASTSET 3X (CAST SUPPLIES) ×5
SPONGE GAUZE 4X4 12PLY (GAUZE/BANDAGES/DRESSINGS) ×3 IMPLANT
STOCKINETTE 4X48 STRL (DRAPES) ×3 IMPLANT
STRIP CLOSURE SKIN 1/2X4 (GAUZE/BANDAGES/DRESSINGS) ×3 IMPLANT
SUT PROLENE 3 0 PS 2 (SUTURE) ×3 IMPLANT
SYR 3ML 23GX1 SAFETY (SYRINGE) IMPLANT
SYR CONTROL 10ML LL (SYRINGE) IMPLANT
TOWEL OR 17X24 6PK STRL BLUE (TOWEL DISPOSABLE) ×3 IMPLANT
TRAY DSU PREP LF (CUSTOM PROCEDURE TRAY) ×3 IMPLANT
UNDERPAD 30X30 INCONTINENT (UNDERPADS AND DIAPERS) ×3 IMPLANT
WATER STERILE IRR 1000ML POUR (IV SOLUTION) ×3 IMPLANT

## 2011-09-29 NOTE — H&P (Signed)
July 01, 2011   Lisa Pollack, MD Fax# [phone]  RE: [name]. [place]   DOB: 04/10/1946 MEDICARE   Dear [name]:  Thank you for referring [name] for a consult regarding her multiple joint complaints and probable bilateral carpal tunnel syndrome. 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 She has had bilateral hand numbness for 2 years, right worse than left. She enjoys Archivist. She also has had some mild neck discomfort most notable when she turns her head to back her car out of the driveway. She has had nocturnal numbness in her hand 7 out of 7 nights a week. She has had no injuries. She has also noted mild stenosing tenosynovitis of her right thumb.   Her past history is reviewed in detail. She is 5'2" and 113 lbs. She has not used any medication for her symptoms. She reports that she is intolerant to Percocet, Vicodin and nonsteroidal medications. Current medications include Atenolol 50 mg daily and Procardia 60 mg daily. She takes vitamin D supplements 1,000 IU daily. Her social history reveals she is divorced. She is a nonsmoker. She does not use alcoholic beverages. She is a retired Immunologist. Her family history is detailed and positive for coronary artery disease, hypertension and arthritis. A 14 system review of systems reveals corrective lenses, hypertension, and history of peptic ulcer disease and intolerance to nonsteroidal medication.  Physical exam reveals a well appearing 65 year old woman. Inspection of her hands reveals no thenar atrophy. She has positive wrist flexion tests bilaterally, positive Tinel's sign bilaterally. She has stenosing tenosynovitis of her right thumb with tenderness over the A-1 pulley. Her pulses and capillary refill are intact. There is no sign of stenosing tenosynovitis of her fingers, left thumb or first dorsal compartments.   Dr. Johna Gallagher completed detailed electrodiagnostic studies. These revealed evidence of  moderately severe right carpal tunnel syndrome and moderate left carpal tunnel syndrome. Her sensory amplitude was 21 micro-bolts left and 10.3 micro-volts right. Lisa Pollack, MD Page 2 July 01, 2011  RE: [name]. [place]    DOB: 03/12/1946  Assessment: Moderately severe right carpal tunnel syndrome and stenosing tenosynovitis right thumb.  Plan: We will schedule her for release of the right transverse carpal ligament and possible release of the right thumb A-1 pulley. We will examine her in the holding area. Questions regarding the surgery were invited and answered in detail.   I will keep you informed of her progress. Thank you for allowing me to participate in the care of your patients.  Best regards,    Katy Fitch. Naaman Plummer., MD RVS/phe T: 07-02-11   H & P updated as 09/29/2011.  No significant changes noted.

## 2011-09-29 NOTE — Brief Op Note (Signed)
09/29/2011  7:32 AM  PATIENT:  [name]  65 y.o. female  PRE-OPERATIVE DIAGNOSIS:  left cts, left trigger thumb and left ring finger trigger  POST-OPERATIVE DIAGNOSIS:  Left cts, left trigger thumb*and left ring finger trigger  PROCEDURE:  Procedure(s): CARPAL TUNNEL RELEASE RELEASE TRIGGER FINGER/A-1 PULLEY RELEASE LEFT RING TRIGGER FINGER  SURGEON:  Surgeon(s): Wyn Forster., MD  PHYSICIAN ASSISTANT: Annye Rusk, P.A.-C  ASSISTANTS: Annye Rusk, P.A.- C   ANESTHESIA:   General by LMA  EBL:     BLOOD ADMINISTERED:none  DRAINS: none   LOCAL MEDICATIONS USED:  LIDOCAINE 5.5 CC  SPECIMEN:  No Specimen  DISPOSITION OF SPECIMEN:  N/A  COUNTS:  YES  TOURNIQUET:  220 mm Hg  DICTATION: .Other Dictation: Dictation Number [id number]  PLAN OF CARE: Discharge to home after PACU  PATIENT DISPOSITION:  home   Delay start of Pharmacological VTE agent (>24hrs) due to surgical blood loss or risk of bleeding:  not applicable

## 2011-09-29 NOTE — Anesthesia Preprocedure Evaluation (Addendum)
Anesthesia Evaluation  Patient identified by MRN, date of birth, ID band Patient awake    Reviewed: Allergy & Precautions, H&P , NPO status , Patient's Chart, lab work & pertinent test results  Airway Mallampati: I TM Distance: >3 FB Neck ROM: Full    Dental No notable dental hx. (+) Teeth Intact   Pulmonary    Pulmonary exam normal       Cardiovascular hypertension, Regular Normal    Neuro/Psych    GI/Hepatic   Endo/Other    Renal/GU      Musculoskeletal   Abdominal Normal abdominal exam  (+)   Peds  Hematology   Anesthesia Other Findings   Reproductive/Obstetrics                          Anesthesia Physical Anesthesia Plan  ASA: II  Anesthesia Plan: General   Post-op Pain Management:    Induction: Intravenous  Airway Management Planned: LMA  Additional Equipment:   Intra-op Plan:   Post-operative Plan: Extubation in OR  Informed Consent: I have reviewed the patients History and Physical, chart, labs and discussed the procedure including the risks, benefits and alternatives for the proposed anesthesia with the patient or authorized representative who has indicated his/her understanding and acceptance.   Dental Advisory Given  Plan Discussed with: CRNA and Surgeon  Anesthesia Plan Comments:        Anesthesia Quick Evaluation

## 2011-09-29 NOTE — H&P (Signed)
  [name] is an 65 y.o. female.   Chief Complaint:c/o 1-2 year history of left hand numbness and tingling and triggering of left thumb and left ring HPI: Presented to our office with carpal tunnel symptoms and stenosing tenosynovitis of her left thumb and left ring finger. Had NCV performed and this revealed bilat. CTS.   Past Medical History  Diagnosis Date  . Hypertension     Past Surgical History  Procedure Date  . Appendectomy   . Colon surgery     2 perforated ulcers surgery  . Vaginal delivery x 2   . Ganglion cyst removed on foot   . Hernia repair     femoral inguinal hernia repair on right  . Abdominal hysterectomy   . Right hand surgery     No family history on file. Social History:  does not have a smoking history on file. She does not have any smokeless tobacco history on file. Her alcohol and drug histories not on file.  Allergies:  Allergies  Allergen Reactions  . Codeine   . Nsaids     Perforated ulcers  . Percocet (Oxycodone-Acetaminophen)   . Vicodin (Hydrocodone-Acetaminophen)     Medications Prior to Admission  Medication Dose Route Frequency Provider Last Rate Last Dose  . lactated ringers infusion   Intravenous Continuous Constance Goltz, MD       Medications Prior to Admission  Medication Sig Dispense Refill  . atenolol (TENORMIN) 50 MG tablet Take 50 mg by mouth at bedtime.        . cholecalciferol (VITAMIN D) 1000 UNITS tablet Take 1,000 Units by mouth daily. Takes 2000 units in one tab daily at night       . Multiple Vitamin (MULTIVITAMIN) capsule Take 1 capsule by mouth every morning.        Marland Kitchen NIFEdipine (PROCARDIA XL/ADALAT-CC) 60 MG 24 hr tablet Take 60 mg by mouth at bedtime.          No results found for this or any previous visit (from the past 48 hour(s)). No results found.  ROS  Blood pressure 169/98, pulse 66, temperature 97.5 F (36.4 C), temperature source Oral, resp. rate 18, height 5\' 2"  (1.575 m), weight 49.896  kg (110 lb), SpO2 99.00%. Physical ExamPt has positive phalens and Tinels left hand. Also has STS left thumb and left ring finger. NCV findings C/W left CTS. Remainder of pre-op H&P was all within normal limits Assessment/Plan 1.Left carpal tunnel syndrome     2. Stenosing tenosynovitis left thumb and left ring finger.  Nakhi Choi J 09/29/2011, 7:18 AM

## 2011-09-29 NOTE — Transfer of Care (Signed)
Immediate Anesthesia Transfer of Care Note  Patient: [name]  Procedure(s) Performed:  CARPAL TUNNEL RELEASE - left carpal tunnel release; RELEASE TRIGGER FINGER/A-1 PULLEY - release a-1 pulley left thumb; RELEASE TRIGGER FINGER/A-1 PULLEY - Ring Finger  Patient Location: PACU  Anesthesia Type: General  Level of Consciousness: awake and oriented  Airway & Oxygen Therapy: Patient Spontanous Breathing and Patient connected to nasal cannula oxygen  Post-op Assessment: Report given to PACU RN, Post -op Vital signs reviewed and stable and Patient moving all extremities X 4  Post vital signs: Reviewed and stable  Complications: No apparent anesthesia complications

## 2011-09-29 NOTE — Anesthesia Postprocedure Evaluation (Signed)
  Anesthesia Post-op Note  Patient: [name]  Procedure(s) Performed:  CARPAL TUNNEL RELEASE - left carpal tunnel release; RELEASE TRIGGER FINGER/A-1 PULLEY - release a-1 pulley left thumb; RELEASE TRIGGER FINGER/A-1 PULLEY - Ring Finger  Patient Location: PACU  Anesthesia Type: General  Level of Consciousness: awake, alert  and oriented  Airway and Oxygen Therapy: Patient Spontanous Breathing  Post-op Pain: mild  Post-op Assessment: Post-op Vital signs reviewed, Patient's Cardiovascular Status Stable, Respiratory Function Stable, Patent Airway, No signs of Nausea or vomiting, Adequate PO intake and Pain level controlled  Post-op Vital Signs: stable  Complications: No apparent anesthesia complications

## 2011-09-29 NOTE — Anesthesia Procedure Notes (Addendum)
Performed by: Meyer Russel   Procedure Name: LMA Insertion Date/Time: 09/29/2011 7:43 AM Performed by: Meyer Russel Pre-anesthesia Checklist: Patient identified, Timeout performed, Emergency Drugs available, Suction available and Patient being monitored Patient Re-evaluated:Patient Re-evaluated prior to inductionOxygen Delivery Method: Circle System Utilized Preoxygenation: Pre-oxygenation with 100% oxygen Intubation Type: IV induction Ventilation: Mask ventilation without difficulty LMA: LMA inserted LMA Size: 3.0 Number of attempts: 1

## 2011-09-29 NOTE — Op Note (Signed)
Dictated op note

## 2011-09-30 NOTE — Op Note (Signed)
[name], [name]                ACCOUNT NO.:  [id number]  MEDICAL RECORD NO.:  [id number]  LOCATION:                                 FACILITY:  PHYSICIAN:  [name], M.D.      DATE OF BIRTH:  DATE OF PROCEDURE:  09/29/2011 DATE OF DISCHARGE:                              OPERATIVE REPORT   PREOPERATIVE DIAGNOSES: 1. Chronic entrapment neuropathy, left median nerve at carpal tunnel. 2. Chronic stenosing tenosynovitis, left thumb. 3. New-onset stenosing tenosynovitis, left ring finger.  POSTOPERATIVE DIAGNOSES: 1. Chronic entrapment neuropathy, left median nerve at carpal tunnel. 2. Chronic stenosing tenosynovitis, left thumb. 3. New-onset stenosing tenosynovitis, left ring finger.  OPERATION: 1. Release of left transverse carpal ligament. 2. Release of left thumb A1 pulley. 3. Release of left ring finger A1 pulley.  OPERATING SURGEON:  Lisa Fitch. Evania Lyne, MD  ASSISTANT:  Marveen Reeks Dasnoit, PA-C  ANESTHESIA:  General by LMA.  SUPERVISING ANESTHESIOLOGIST:  Bedelia Person, MD  INDICATIONS:  [name] is a 65 year old woman familiar with our practice.  She has had bilateral carpal tunnel syndrome, and has had previous care of her right carpal tunnel.  In August, she presented for evaluation of carpal tunnel syndrome and trigger fingers.  She is scheduled through this time for release of her left transverse carpal ligament, her left thumb A1 pulley, and after detailed examination in the preoperative holding area due to the presence of triggering of her left ring finger, we will also add release of her left ring finger A1 pulley at this time.  Preoperatively, she was reminded the potential risks and benefits of surgery.  The goals of surgery are to relieve her numbness in her left median innervated fingers and relieve mechanical symptoms of locking of her left thumb and left ring finger, most notable in the morning.  Potential complications would be failure to relieve  all of her symptoms of numbness and triggering, possible infection, possible neurovascular injury, and difficult scarring.  She understands that this is a straightforward mechanical procedure with typically predictable results.  Questions were invited and answered in detail preoperatively.  Preoperatively, she had a detailed anesthesia informed consent by Dr. Gypsy Balsam who recommended general anesthesia by LMA.  This was accepted by [name].  PROCEDURE:  In room 6 under Dr. Burnett Corrente direct supervision, general anesthesia by LMA technique was induced.  The left arm was prepped with Betadine soap solution and sterilely draped.  A pneumatic tourniquet was applied to the proximal left brachium and set at 220 mmHg.  After a routine surgical time-out was accomplished with the surgical team, we proceeded to exsanguinate the left arm with an Esmarch bandage and inflate the arterial tourniquet to 220 mmHg.  Procedure commenced with a short incision in line of the ring finger and the palm. Subcutaneous tissues were carefully divided revealing the palmar fascia. This was split longitudinally to reveal the common sensory branch of the median nerve.  These were followed back to the median nerve proper which was gently isolated from the transverse carpal ligament with a Insurance risk surveyor.  The ligaments were released with scissors along its ulnar border extending into the distal  forearm.  The volar forearm fascia was likewise released.  No bleeding points were problematic.  The wound was then repaired with intradermal 3-0 Prolene suture.  Attention then directed to the left thumb where a short transverse incision was fashioned directly over the palpably thickened A1 pulley. Subcutaneous tissues were carefully divided taking care to identify and gently retract the radial and proper digital nerve.  The A1 pulley of the thumb was released with scalpel  and scissors.  The flexor pollicis longus  tendon was then examined and ranged through full IP range of motion without obstruction.  This wound was then repaired with intradermal 3-0 Prolene suture.  Attention then directed to the left ring finger A1 pulley.  This was exposed through a short oblique incision directly over the palpably thickened pulley.  Subcutaneous tissues were carefully divided taking care to release the pretendinous fibers of the palmar fascia.  The radial and ulnar neurovascular bundles were retracted with blunt Ragnell retractors followed by release of the A1 pulley with scalpel and scissors.  The tendons were then noted to glide normally through the flexor sheath without obstruction.  The wound was repaired with intradermal 3-0 Prolene suture.  For aftercare, Steri-Strips were applied followed by compressive gauze dressing with sterile gauze and Coban.  [name] was awakened from general anesthesia, transferred to recovery room with stable signs.  She will be discharged to the care of her family with prescriptions for tramadol 1-2 tablets p.o. q.4-6 h. p.r.n. pain, 20 tablets without refill.  She will contact our office p.r.n. difficulties with discomfort and may use other medications in the perioperative period.     Lisa Fitch Oluwasemilore Pascuzzi, M.D.     RVS/MEDQ  D:  09/29/2011  T:  09/29/2011  Job:  [id number]

## 2011-10-02 ENCOUNTER — Ambulatory Visit
Admission: RE | Admit: 2011-10-02 | Discharge: 2011-10-02 | Disposition: A | Discharge status: Home / Self Care | Payer: Medicare Other | Source: Ambulatory Visit | Attending: Family Medicine | Admitting: Family Medicine

## 2011-10-02 DIAGNOSIS — R928 Other abnormal and inconclusive findings on diagnostic imaging of breast: Secondary | ICD-10-CM

## 2011-10-05 ENCOUNTER — Encounter (HOSPITAL_BASED_OUTPATIENT_CLINIC_OR_DEPARTMENT_OTHER): Payer: Self-pay | Admitting: Orthopedic Surgery

## 2011-12-07 DIAGNOSIS — M899 Disorder of bone, unspecified: Secondary | ICD-10-CM | POA: Diagnosis not present

## 2011-12-07 DIAGNOSIS — M949 Disorder of cartilage, unspecified: Secondary | ICD-10-CM | POA: Diagnosis not present

## 2012-02-03 DIAGNOSIS — L570 Actinic keratosis: Secondary | ICD-10-CM | POA: Diagnosis not present

## 2012-02-03 DIAGNOSIS — Z85828 Personal history of other malignant neoplasm of skin: Secondary | ICD-10-CM | POA: Diagnosis not present

## 2012-02-03 DIAGNOSIS — D239 Other benign neoplasm of skin, unspecified: Secondary | ICD-10-CM | POA: Diagnosis not present

## 2012-02-03 DIAGNOSIS — L821 Other seborrheic keratosis: Secondary | ICD-10-CM | POA: Diagnosis not present

## 2012-08-15 DIAGNOSIS — D1801 Hemangioma of skin and subcutaneous tissue: Secondary | ICD-10-CM | POA: Diagnosis not present

## 2012-08-15 DIAGNOSIS — L570 Actinic keratosis: Secondary | ICD-10-CM | POA: Diagnosis not present

## 2012-08-15 DIAGNOSIS — L821 Other seborrheic keratosis: Secondary | ICD-10-CM | POA: Diagnosis not present

## 2012-08-15 DIAGNOSIS — L259 Unspecified contact dermatitis, unspecified cause: Secondary | ICD-10-CM | POA: Diagnosis not present

## 2012-08-15 DIAGNOSIS — L819 Disorder of pigmentation, unspecified: Secondary | ICD-10-CM | POA: Diagnosis not present

## 2012-08-15 DIAGNOSIS — D239 Other benign neoplasm of skin, unspecified: Secondary | ICD-10-CM | POA: Diagnosis not present

## 2012-08-22 DIAGNOSIS — Z23 Encounter for immunization: Secondary | ICD-10-CM | POA: Diagnosis not present

## 2012-08-29 ENCOUNTER — Other Ambulatory Visit: Payer: Self-pay | Admitting: Family Medicine

## 2012-08-29 DIAGNOSIS — Z1231 Encounter for screening mammogram for malignant neoplasm of breast: Secondary | ICD-10-CM

## 2012-10-03 ENCOUNTER — Ambulatory Visit
Admission: RE | Admit: 2012-10-03 | Discharge: 2012-10-03 | Disposition: A | Discharge status: Home / Self Care | Payer: Medicare Other | Source: Ambulatory Visit | Attending: Family Medicine | Admitting: Family Medicine

## 2012-10-03 DIAGNOSIS — Z1231 Encounter for screening mammogram for malignant neoplasm of breast: Secondary | ICD-10-CM | POA: Diagnosis not present

## 2012-10-12 DIAGNOSIS — Z79899 Other long term (current) drug therapy: Secondary | ICD-10-CM | POA: Diagnosis not present

## 2012-10-12 DIAGNOSIS — I10 Essential (primary) hypertension: Secondary | ICD-10-CM | POA: Diagnosis not present

## 2012-10-12 DIAGNOSIS — M810 Age-related osteoporosis without current pathological fracture: Secondary | ICD-10-CM | POA: Diagnosis not present

## 2012-10-12 DIAGNOSIS — E559 Vitamin D deficiency, unspecified: Secondary | ICD-10-CM | POA: Diagnosis not present

## 2012-10-12 DIAGNOSIS — Z0000 Encounter for general adult medical examination without abnormal findings: Secondary | ICD-10-CM | POA: Diagnosis not present

## 2012-10-12 DIAGNOSIS — E7800 Pure hypercholesterolemia, unspecified: Secondary | ICD-10-CM | POA: Diagnosis not present

## 2012-10-12 DIAGNOSIS — Z1331 Encounter for screening for depression: Secondary | ICD-10-CM | POA: Diagnosis not present

## 2012-12-06 DIAGNOSIS — M810 Age-related osteoporosis without current pathological fracture: Secondary | ICD-10-CM | POA: Diagnosis not present

## 2012-12-06 DIAGNOSIS — R6889 Other general symptoms and signs: Secondary | ICD-10-CM | POA: Diagnosis not present

## 2012-12-14 ENCOUNTER — Ambulatory Visit: Payer: Medicare Other | Attending: Internal Medicine | Admitting: Physical Therapy

## 2012-12-14 DIAGNOSIS — M25619 Stiffness of unspecified shoulder, not elsewhere classified: Secondary | ICD-10-CM | POA: Insufficient documentation

## 2012-12-14 DIAGNOSIS — M25519 Pain in unspecified shoulder: Secondary | ICD-10-CM | POA: Insufficient documentation

## 2012-12-14 DIAGNOSIS — IMO0001 Reserved for inherently not codable concepts without codable children: Secondary | ICD-10-CM | POA: Diagnosis not present

## 2012-12-14 DIAGNOSIS — M810 Age-related osteoporosis without current pathological fracture: Secondary | ICD-10-CM | POA: Diagnosis not present

## 2012-12-14 DIAGNOSIS — M2569 Stiffness of other specified joint, not elsewhere classified: Secondary | ICD-10-CM | POA: Insufficient documentation

## 2012-12-21 ENCOUNTER — Ambulatory Visit: Payer: Medicare Other | Admitting: Physical Therapy

## 2012-12-21 DIAGNOSIS — IMO0001 Reserved for inherently not codable concepts without codable children: Secondary | ICD-10-CM | POA: Diagnosis not present

## 2012-12-21 DIAGNOSIS — M810 Age-related osteoporosis without current pathological fracture: Secondary | ICD-10-CM | POA: Diagnosis not present

## 2012-12-21 DIAGNOSIS — M25619 Stiffness of unspecified shoulder, not elsewhere classified: Secondary | ICD-10-CM | POA: Diagnosis not present

## 2012-12-21 DIAGNOSIS — M25519 Pain in unspecified shoulder: Secondary | ICD-10-CM | POA: Diagnosis not present

## 2012-12-26 DIAGNOSIS — E7800 Pure hypercholesterolemia, unspecified: Secondary | ICD-10-CM | POA: Diagnosis not present

## 2012-12-26 DIAGNOSIS — Z79899 Other long term (current) drug therapy: Secondary | ICD-10-CM | POA: Diagnosis not present

## 2012-12-28 ENCOUNTER — Ambulatory Visit: Payer: Medicare Other | Attending: Internal Medicine | Admitting: Physical Therapy

## 2012-12-28 DIAGNOSIS — IMO0001 Reserved for inherently not codable concepts without codable children: Secondary | ICD-10-CM | POA: Diagnosis not present

## 2012-12-28 DIAGNOSIS — M25619 Stiffness of unspecified shoulder, not elsewhere classified: Secondary | ICD-10-CM | POA: Diagnosis not present

## 2012-12-28 DIAGNOSIS — M25519 Pain in unspecified shoulder: Secondary | ICD-10-CM | POA: Diagnosis not present

## 2012-12-28 DIAGNOSIS — M2569 Stiffness of other specified joint, not elsewhere classified: Secondary | ICD-10-CM | POA: Insufficient documentation

## 2013-01-04 ENCOUNTER — Ambulatory Visit: Payer: Medicare Other | Admitting: Physical Therapy

## 2013-01-10 ENCOUNTER — Ambulatory Visit: Payer: Medicare Other | Admitting: Physical Therapy

## 2013-01-10 DIAGNOSIS — M25619 Stiffness of unspecified shoulder, not elsewhere classified: Secondary | ICD-10-CM | POA: Diagnosis not present

## 2013-01-10 DIAGNOSIS — IMO0001 Reserved for inherently not codable concepts without codable children: Secondary | ICD-10-CM | POA: Diagnosis not present

## 2013-01-10 DIAGNOSIS — M25519 Pain in unspecified shoulder: Secondary | ICD-10-CM | POA: Diagnosis not present

## 2013-03-22 DIAGNOSIS — M810 Age-related osteoporosis without current pathological fracture: Secondary | ICD-10-CM | POA: Diagnosis not present

## 2013-04-04 ENCOUNTER — Other Ambulatory Visit (HOSPITAL_COMMUNITY): Payer: Self-pay | Admitting: *Deleted

## 2013-04-05 ENCOUNTER — Encounter (HOSPITAL_COMMUNITY)
Admission: RE | Admit: 2013-04-05 | Discharge: 2013-04-05 | Disposition: A | Discharge status: Home / Self Care | Payer: Medicare Other | Source: Ambulatory Visit | Attending: Internal Medicine | Admitting: Internal Medicine

## 2013-04-05 DIAGNOSIS — M810 Age-related osteoporosis without current pathological fracture: Secondary | ICD-10-CM | POA: Insufficient documentation

## 2013-04-05 MED ORDER — DENOSUMAB 60 MG/ML ~~LOC~~ SOLN
60.0000 mg | Freq: Once | SUBCUTANEOUS | Status: AC
  Administered 2013-04-05: 60 mg via SUBCUTANEOUS
  Filled 2013-04-05: qty 1

## 2013-07-10 DIAGNOSIS — M810 Age-related osteoporosis without current pathological fracture: Secondary | ICD-10-CM | POA: Diagnosis not present

## 2013-08-25 DIAGNOSIS — Z23 Encounter for immunization: Secondary | ICD-10-CM | POA: Diagnosis not present

## 2013-08-28 ENCOUNTER — Other Ambulatory Visit: Payer: Self-pay

## 2013-08-28 DIAGNOSIS — Z1231 Encounter for screening mammogram for malignant neoplasm of breast: Secondary | ICD-10-CM

## 2013-09-11 DIAGNOSIS — L819 Disorder of pigmentation, unspecified: Secondary | ICD-10-CM | POA: Diagnosis not present

## 2013-09-11 DIAGNOSIS — T148 Other injury of unspecified body region: Secondary | ICD-10-CM | POA: Diagnosis not present

## 2013-09-11 DIAGNOSIS — D239 Other benign neoplasm of skin, unspecified: Secondary | ICD-10-CM | POA: Diagnosis not present

## 2013-09-11 DIAGNOSIS — D1801 Hemangioma of skin and subcutaneous tissue: Secondary | ICD-10-CM | POA: Diagnosis not present

## 2013-09-11 DIAGNOSIS — L821 Other seborrheic keratosis: Secondary | ICD-10-CM | POA: Diagnosis not present

## 2013-09-11 DIAGNOSIS — Z85828 Personal history of other malignant neoplasm of skin: Secondary | ICD-10-CM | POA: Diagnosis not present

## 2013-09-11 DIAGNOSIS — L570 Actinic keratosis: Secondary | ICD-10-CM | POA: Diagnosis not present

## 2013-10-04 ENCOUNTER — Ambulatory Visit
Admission: RE | Admit: 2013-10-04 | Discharge: 2013-10-04 | Disposition: A | Discharge status: Home / Self Care | Payer: Medicare Other | Source: Ambulatory Visit

## 2013-10-04 DIAGNOSIS — Z1231 Encounter for screening mammogram for malignant neoplasm of breast: Secondary | ICD-10-CM | POA: Diagnosis not present

## 2013-10-18 DIAGNOSIS — M810 Age-related osteoporosis without current pathological fracture: Secondary | ICD-10-CM | POA: Diagnosis not present

## 2013-10-25 DIAGNOSIS — M810 Age-related osteoporosis without current pathological fracture: Secondary | ICD-10-CM | POA: Diagnosis not present

## 2013-10-25 DIAGNOSIS — E559 Vitamin D deficiency, unspecified: Secondary | ICD-10-CM | POA: Diagnosis not present

## 2013-10-25 DIAGNOSIS — Z1331 Encounter for screening for depression: Secondary | ICD-10-CM | POA: Diagnosis not present

## 2013-10-25 DIAGNOSIS — I10 Essential (primary) hypertension: Secondary | ICD-10-CM | POA: Diagnosis not present

## 2013-10-25 DIAGNOSIS — E7800 Pure hypercholesterolemia, unspecified: Secondary | ICD-10-CM | POA: Diagnosis not present

## 2013-10-25 DIAGNOSIS — Z0000 Encounter for general adult medical examination without abnormal findings: Secondary | ICD-10-CM | POA: Diagnosis not present

## 2014-04-09 DIAGNOSIS — M810 Age-related osteoporosis without current pathological fracture: Secondary | ICD-10-CM | POA: Diagnosis not present

## 2014-04-18 DIAGNOSIS — M810 Age-related osteoporosis without current pathological fracture: Secondary | ICD-10-CM | POA: Diagnosis not present

## 2014-09-03 DIAGNOSIS — Z23 Encounter for immunization: Secondary | ICD-10-CM | POA: Diagnosis not present

## 2014-09-05 ENCOUNTER — Other Ambulatory Visit: Payer: Self-pay

## 2014-09-05 DIAGNOSIS — Z1231 Encounter for screening mammogram for malignant neoplasm of breast: Secondary | ICD-10-CM

## 2014-10-08 ENCOUNTER — Encounter (INDEPENDENT_AMBULATORY_CARE_PROVIDER_SITE_OTHER): Payer: Self-pay

## 2014-10-08 ENCOUNTER — Ambulatory Visit
Admission: RE | Admit: 2014-10-08 | Discharge: 2014-10-08 | Disposition: A | Discharge status: Home / Self Care | Payer: Medicare Other | Source: Ambulatory Visit

## 2014-10-08 DIAGNOSIS — Z1231 Encounter for screening mammogram for malignant neoplasm of breast: Secondary | ICD-10-CM | POA: Diagnosis not present

## 2014-10-10 DIAGNOSIS — L821 Other seborrheic keratosis: Secondary | ICD-10-CM | POA: Diagnosis not present

## 2014-10-10 DIAGNOSIS — D1801 Hemangioma of skin and subcutaneous tissue: Secondary | ICD-10-CM | POA: Diagnosis not present

## 2014-10-10 DIAGNOSIS — D692 Other nonthrombocytopenic purpura: Secondary | ICD-10-CM | POA: Diagnosis not present

## 2014-10-10 DIAGNOSIS — L570 Actinic keratosis: Secondary | ICD-10-CM | POA: Diagnosis not present

## 2014-10-10 DIAGNOSIS — Z85828 Personal history of other malignant neoplasm of skin: Secondary | ICD-10-CM | POA: Diagnosis not present

## 2014-10-22 DIAGNOSIS — M810 Age-related osteoporosis without current pathological fracture: Secondary | ICD-10-CM | POA: Diagnosis not present

## 2014-11-07 DIAGNOSIS — Z0000 Encounter for general adult medical examination without abnormal findings: Secondary | ICD-10-CM | POA: Diagnosis not present

## 2014-11-07 DIAGNOSIS — I10 Essential (primary) hypertension: Secondary | ICD-10-CM | POA: Diagnosis not present

## 2014-11-07 DIAGNOSIS — E780 Pure hypercholesterolemia: Secondary | ICD-10-CM | POA: Diagnosis not present

## 2014-11-07 DIAGNOSIS — M810 Age-related osteoporosis without current pathological fracture: Secondary | ICD-10-CM | POA: Diagnosis not present

## 2014-11-07 DIAGNOSIS — E559 Vitamin D deficiency, unspecified: Secondary | ICD-10-CM | POA: Diagnosis not present

## 2014-11-26 DIAGNOSIS — M8580 Other specified disorders of bone density and structure, unspecified site: Secondary | ICD-10-CM | POA: Diagnosis not present

## 2014-12-10 DIAGNOSIS — Z23 Encounter for immunization: Secondary | ICD-10-CM | POA: Diagnosis not present

## 2014-12-10 DIAGNOSIS — E780 Pure hypercholesterolemia: Secondary | ICD-10-CM | POA: Diagnosis not present

## 2014-12-28 DIAGNOSIS — L570 Actinic keratosis: Secondary | ICD-10-CM | POA: Diagnosis not present

## 2014-12-28 DIAGNOSIS — Z85828 Personal history of other malignant neoplasm of skin: Secondary | ICD-10-CM | POA: Diagnosis not present

## 2014-12-28 DIAGNOSIS — D1801 Hemangioma of skin and subcutaneous tissue: Secondary | ICD-10-CM | POA: Diagnosis not present

## 2015-01-15 DIAGNOSIS — E780 Pure hypercholesterolemia: Secondary | ICD-10-CM | POA: Diagnosis not present

## 2015-02-04 DIAGNOSIS — M25511 Pain in right shoulder: Secondary | ICD-10-CM | POA: Diagnosis not present

## 2015-02-27 DIAGNOSIS — M25511 Pain in right shoulder: Secondary | ICD-10-CM | POA: Diagnosis not present

## 2015-03-04 DIAGNOSIS — H04123 Dry eye syndrome of bilateral lacrimal glands: Secondary | ICD-10-CM | POA: Diagnosis not present

## 2015-03-04 DIAGNOSIS — H2513 Age-related nuclear cataract, bilateral: Secondary | ICD-10-CM | POA: Diagnosis not present

## 2015-04-02 DIAGNOSIS — H2513 Age-related nuclear cataract, bilateral: Secondary | ICD-10-CM | POA: Diagnosis not present

## 2015-04-26 DIAGNOSIS — M810 Age-related osteoporosis without current pathological fracture: Secondary | ICD-10-CM | POA: Diagnosis not present

## 2015-06-26 DIAGNOSIS — H25812 Combined forms of age-related cataract, left eye: Secondary | ICD-10-CM | POA: Diagnosis not present

## 2015-06-26 DIAGNOSIS — H2512 Age-related nuclear cataract, left eye: Secondary | ICD-10-CM | POA: Diagnosis not present

## 2015-07-09 DIAGNOSIS — E780 Pure hypercholesterolemia: Secondary | ICD-10-CM | POA: Diagnosis not present

## 2015-08-08 DIAGNOSIS — Z23 Encounter for immunization: Secondary | ICD-10-CM | POA: Diagnosis not present

## 2015-08-09 DIAGNOSIS — N390 Urinary tract infection, site not specified: Secondary | ICD-10-CM | POA: Diagnosis not present

## 2015-08-09 DIAGNOSIS — R309 Painful micturition, unspecified: Secondary | ICD-10-CM | POA: Diagnosis not present

## 2015-08-14 DIAGNOSIS — H2511 Age-related nuclear cataract, right eye: Secondary | ICD-10-CM | POA: Diagnosis not present

## 2015-08-14 DIAGNOSIS — H25811 Combined forms of age-related cataract, right eye: Secondary | ICD-10-CM | POA: Diagnosis not present

## 2015-08-30 ENCOUNTER — Other Ambulatory Visit: Payer: Self-pay

## 2015-08-30 DIAGNOSIS — Z1231 Encounter for screening mammogram for malignant neoplasm of breast: Secondary | ICD-10-CM

## 2015-10-15 ENCOUNTER — Ambulatory Visit: Payer: Medicare Other

## 2015-11-01 DIAGNOSIS — M810 Age-related osteoporosis without current pathological fracture: Secondary | ICD-10-CM | POA: Diagnosis not present

## 2015-11-05 ENCOUNTER — Ambulatory Visit
Admission: RE | Admit: 2015-11-05 | Discharge: 2015-11-05 | Disposition: A | Discharge status: Home / Self Care | Payer: Medicare Other | Source: Ambulatory Visit

## 2015-11-05 DIAGNOSIS — Z1231 Encounter for screening mammogram for malignant neoplasm of breast: Secondary | ICD-10-CM | POA: Diagnosis not present

## 2015-12-04 DIAGNOSIS — I10 Essential (primary) hypertension: Secondary | ICD-10-CM | POA: Diagnosis not present

## 2015-12-04 DIAGNOSIS — M810 Age-related osteoporosis without current pathological fracture: Secondary | ICD-10-CM | POA: Diagnosis not present

## 2015-12-04 DIAGNOSIS — E559 Vitamin D deficiency, unspecified: Secondary | ICD-10-CM | POA: Diagnosis not present

## 2015-12-04 DIAGNOSIS — Z0000 Encounter for general adult medical examination without abnormal findings: Secondary | ICD-10-CM | POA: Diagnosis not present

## 2015-12-04 DIAGNOSIS — Z1389 Encounter for screening for other disorder: Secondary | ICD-10-CM | POA: Diagnosis not present

## 2015-12-04 DIAGNOSIS — E7800 Pure hypercholesterolemia, unspecified: Secondary | ICD-10-CM | POA: Diagnosis not present

## 2016-02-03 DIAGNOSIS — E7800 Pure hypercholesterolemia, unspecified: Secondary | ICD-10-CM | POA: Diagnosis not present

## 2016-03-17 DIAGNOSIS — H43811 Vitreous degeneration, right eye: Secondary | ICD-10-CM | POA: Diagnosis not present

## 2016-04-06 DIAGNOSIS — L570 Actinic keratosis: Secondary | ICD-10-CM | POA: Diagnosis not present

## 2016-04-06 DIAGNOSIS — D2272 Melanocytic nevi of left lower limb, including hip: Secondary | ICD-10-CM | POA: Diagnosis not present

## 2016-04-06 DIAGNOSIS — Z85828 Personal history of other malignant neoplasm of skin: Secondary | ICD-10-CM | POA: Diagnosis not present

## 2016-04-06 DIAGNOSIS — D1801 Hemangioma of skin and subcutaneous tissue: Secondary | ICD-10-CM | POA: Diagnosis not present

## 2016-04-14 DIAGNOSIS — H43812 Vitreous degeneration, left eye: Secondary | ICD-10-CM | POA: Diagnosis not present

## 2016-04-23 DIAGNOSIS — E7800 Pure hypercholesterolemia, unspecified: Secondary | ICD-10-CM | POA: Diagnosis not present

## 2016-05-08 DIAGNOSIS — M810 Age-related osteoporosis without current pathological fracture: Secondary | ICD-10-CM | POA: Diagnosis not present

## 2016-07-28 DIAGNOSIS — L570 Actinic keratosis: Secondary | ICD-10-CM | POA: Diagnosis not present

## 2016-07-28 DIAGNOSIS — Z85828 Personal history of other malignant neoplasm of skin: Secondary | ICD-10-CM | POA: Diagnosis not present

## 2016-07-28 DIAGNOSIS — L821 Other seborrheic keratosis: Secondary | ICD-10-CM | POA: Diagnosis not present

## 2016-07-28 DIAGNOSIS — D692 Other nonthrombocytopenic purpura: Secondary | ICD-10-CM | POA: Diagnosis not present

## 2016-07-31 DIAGNOSIS — E7800 Pure hypercholesterolemia, unspecified: Secondary | ICD-10-CM | POA: Diagnosis not present

## 2016-08-07 DIAGNOSIS — Z961 Presence of intraocular lens: Secondary | ICD-10-CM | POA: Diagnosis not present

## 2016-08-24 DIAGNOSIS — Z23 Encounter for immunization: Secondary | ICD-10-CM | POA: Diagnosis not present

## 2016-09-28 ENCOUNTER — Other Ambulatory Visit: Payer: Self-pay | Admitting: Family Medicine

## 2016-09-28 DIAGNOSIS — Z1231 Encounter for screening mammogram for malignant neoplasm of breast: Secondary | ICD-10-CM

## 2016-11-05 ENCOUNTER — Ambulatory Visit
Admission: RE | Admit: 2016-11-05 | Discharge: 2016-11-05 | Disposition: A | Discharge status: Home / Self Care | Payer: Medicare Other | Source: Ambulatory Visit | Attending: Family Medicine | Admitting: Family Medicine

## 2016-11-05 DIAGNOSIS — Z1231 Encounter for screening mammogram for malignant neoplasm of breast: Secondary | ICD-10-CM

## 2016-11-10 DIAGNOSIS — M810 Age-related osteoporosis without current pathological fracture: Secondary | ICD-10-CM | POA: Diagnosis not present

## 2016-12-14 DIAGNOSIS — M810 Age-related osteoporosis without current pathological fracture: Secondary | ICD-10-CM | POA: Diagnosis not present

## 2016-12-14 DIAGNOSIS — E7800 Pure hypercholesterolemia, unspecified: Secondary | ICD-10-CM | POA: Diagnosis not present

## 2016-12-14 DIAGNOSIS — Z0000 Encounter for general adult medical examination without abnormal findings: Secondary | ICD-10-CM | POA: Diagnosis not present

## 2016-12-14 DIAGNOSIS — E559 Vitamin D deficiency, unspecified: Secondary | ICD-10-CM | POA: Diagnosis not present

## 2016-12-14 DIAGNOSIS — I10 Essential (primary) hypertension: Secondary | ICD-10-CM | POA: Diagnosis not present

## 2016-12-31 DIAGNOSIS — M810 Age-related osteoporosis without current pathological fracture: Secondary | ICD-10-CM | POA: Diagnosis not present

## 2016-12-31 DIAGNOSIS — M8588 Other specified disorders of bone density and structure, other site: Secondary | ICD-10-CM | POA: Diagnosis not present

## 2017-03-18 DIAGNOSIS — E7800 Pure hypercholesterolemia, unspecified: Secondary | ICD-10-CM | POA: Diagnosis not present

## 2017-05-12 DIAGNOSIS — M8588 Other specified disorders of bone density and structure, other site: Secondary | ICD-10-CM | POA: Diagnosis not present

## 2017-06-28 DIAGNOSIS — L821 Other seborrheic keratosis: Secondary | ICD-10-CM | POA: Diagnosis not present

## 2017-06-28 DIAGNOSIS — Z85828 Personal history of other malignant neoplasm of skin: Secondary | ICD-10-CM | POA: Diagnosis not present

## 2017-06-28 DIAGNOSIS — L853 Xerosis cutis: Secondary | ICD-10-CM | POA: Diagnosis not present

## 2017-06-28 DIAGNOSIS — L578 Other skin changes due to chronic exposure to nonionizing radiation: Secondary | ICD-10-CM | POA: Diagnosis not present

## 2017-06-28 DIAGNOSIS — L570 Actinic keratosis: Secondary | ICD-10-CM | POA: Diagnosis not present

## 2017-06-28 DIAGNOSIS — D1801 Hemangioma of skin and subcutaneous tissue: Secondary | ICD-10-CM | POA: Diagnosis not present

## 2017-07-05 DIAGNOSIS — L509 Urticaria, unspecified: Secondary | ICD-10-CM | POA: Diagnosis not present

## 2017-08-10 DIAGNOSIS — Z961 Presence of intraocular lens: Secondary | ICD-10-CM | POA: Diagnosis not present

## 2017-09-03 DIAGNOSIS — Z23 Encounter for immunization: Secondary | ICD-10-CM | POA: Diagnosis not present

## 2017-09-24 ENCOUNTER — Other Ambulatory Visit: Payer: Self-pay | Admitting: Family Medicine

## 2017-09-24 DIAGNOSIS — Z139 Encounter for screening, unspecified: Secondary | ICD-10-CM

## 2017-11-09 ENCOUNTER — Ambulatory Visit
Admission: RE | Admit: 2017-11-09 | Discharge: 2017-11-09 | Disposition: A | Discharge status: Home / Self Care | Payer: Medicare Other | Source: Ambulatory Visit | Attending: Family Medicine | Admitting: Family Medicine

## 2017-11-09 DIAGNOSIS — Z1231 Encounter for screening mammogram for malignant neoplasm of breast: Secondary | ICD-10-CM | POA: Diagnosis not present

## 2017-11-09 DIAGNOSIS — Z139 Encounter for screening, unspecified: Secondary | ICD-10-CM

## 2017-12-13 DIAGNOSIS — Z8639 Personal history of other endocrine, nutritional and metabolic disease: Secondary | ICD-10-CM | POA: Diagnosis not present

## 2017-12-13 DIAGNOSIS — M810 Age-related osteoporosis without current pathological fracture: Secondary | ICD-10-CM | POA: Diagnosis not present

## 2017-12-13 DIAGNOSIS — Z5181 Encounter for therapeutic drug level monitoring: Secondary | ICD-10-CM | POA: Diagnosis not present

## 2017-12-27 DIAGNOSIS — M810 Age-related osteoporosis without current pathological fracture: Secondary | ICD-10-CM | POA: Diagnosis not present

## 2017-12-27 DIAGNOSIS — E7800 Pure hypercholesterolemia, unspecified: Secondary | ICD-10-CM | POA: Diagnosis not present

## 2017-12-27 DIAGNOSIS — Z0000 Encounter for general adult medical examination without abnormal findings: Secondary | ICD-10-CM | POA: Diagnosis not present

## 2017-12-27 DIAGNOSIS — Z23 Encounter for immunization: Secondary | ICD-10-CM | POA: Diagnosis not present

## 2017-12-27 DIAGNOSIS — Z1159 Encounter for screening for other viral diseases: Secondary | ICD-10-CM | POA: Diagnosis not present

## 2017-12-27 DIAGNOSIS — I10 Essential (primary) hypertension: Secondary | ICD-10-CM | POA: Diagnosis not present

## 2017-12-27 DIAGNOSIS — E559 Vitamin D deficiency, unspecified: Secondary | ICD-10-CM | POA: Diagnosis not present

## 2018-06-28 DIAGNOSIS — N3000 Acute cystitis without hematuria: Secondary | ICD-10-CM | POA: Diagnosis not present

## 2018-06-28 DIAGNOSIS — M810 Age-related osteoporosis without current pathological fracture: Secondary | ICD-10-CM | POA: Diagnosis not present

## 2018-07-08 DIAGNOSIS — N3000 Acute cystitis without hematuria: Secondary | ICD-10-CM | POA: Diagnosis not present

## 2018-08-11 DIAGNOSIS — H26493 Other secondary cataract, bilateral: Secondary | ICD-10-CM | POA: Diagnosis not present

## 2018-08-11 DIAGNOSIS — H5212 Myopia, left eye: Secondary | ICD-10-CM | POA: Diagnosis not present

## 2018-08-26 DIAGNOSIS — Z23 Encounter for immunization: Secondary | ICD-10-CM | POA: Diagnosis not present

## 2018-09-20 DIAGNOSIS — D0439 Carcinoma in situ of skin of other parts of face: Secondary | ICD-10-CM | POA: Diagnosis not present

## 2018-09-20 DIAGNOSIS — Z85828 Personal history of other malignant neoplasm of skin: Secondary | ICD-10-CM | POA: Diagnosis not present

## 2018-09-27 DIAGNOSIS — D0439 Carcinoma in situ of skin of other parts of face: Secondary | ICD-10-CM | POA: Diagnosis not present

## 2018-09-27 DIAGNOSIS — L570 Actinic keratosis: Secondary | ICD-10-CM | POA: Diagnosis not present

## 2018-09-27 DIAGNOSIS — Z85828 Personal history of other malignant neoplasm of skin: Secondary | ICD-10-CM | POA: Diagnosis not present

## 2018-09-30 ENCOUNTER — Other Ambulatory Visit: Payer: Self-pay | Admitting: Family Medicine

## 2018-09-30 DIAGNOSIS — Z1231 Encounter for screening mammogram for malignant neoplasm of breast: Secondary | ICD-10-CM

## 2018-11-10 ENCOUNTER — Ambulatory Visit
Admission: RE | Admit: 2018-11-10 | Discharge: 2018-11-10 | Disposition: A | Discharge status: Home / Self Care | Payer: Medicare Other | Source: Ambulatory Visit | Attending: Family Medicine | Admitting: Family Medicine

## 2018-11-10 DIAGNOSIS — Z1231 Encounter for screening mammogram for malignant neoplasm of breast: Secondary | ICD-10-CM | POA: Diagnosis not present

## 2018-12-15 DIAGNOSIS — I10 Essential (primary) hypertension: Secondary | ICD-10-CM | POA: Diagnosis not present

## 2018-12-15 DIAGNOSIS — N3000 Acute cystitis without hematuria: Secondary | ICD-10-CM | POA: Diagnosis not present

## 2018-12-15 DIAGNOSIS — R413 Other amnesia: Secondary | ICD-10-CM | POA: Diagnosis not present

## 2018-12-23 DIAGNOSIS — N390 Urinary tract infection, site not specified: Secondary | ICD-10-CM | POA: Diagnosis not present

## 2018-12-23 DIAGNOSIS — R3129 Other microscopic hematuria: Secondary | ICD-10-CM | POA: Diagnosis not present

## 2018-12-28 DIAGNOSIS — Z85828 Personal history of other malignant neoplasm of skin: Secondary | ICD-10-CM | POA: Diagnosis not present

## 2019-01-03 DIAGNOSIS — M810 Age-related osteoporosis without current pathological fracture: Secondary | ICD-10-CM | POA: Diagnosis not present

## 2019-01-03 DIAGNOSIS — Z5181 Encounter for therapeutic drug level monitoring: Secondary | ICD-10-CM | POA: Diagnosis not present

## 2019-01-03 DIAGNOSIS — Z8639 Personal history of other endocrine, nutritional and metabolic disease: Secondary | ICD-10-CM | POA: Diagnosis not present

## 2019-01-16 ENCOUNTER — Other Ambulatory Visit: Payer: Self-pay | Admitting: Internal Medicine

## 2019-01-16 DIAGNOSIS — M810 Age-related osteoporosis without current pathological fracture: Secondary | ICD-10-CM

## 2019-02-07 DIAGNOSIS — Z79899 Other long term (current) drug therapy: Secondary | ICD-10-CM | POA: Diagnosis not present

## 2019-02-07 DIAGNOSIS — E7800 Pure hypercholesterolemia, unspecified: Secondary | ICD-10-CM | POA: Diagnosis not present

## 2019-02-07 DIAGNOSIS — E559 Vitamin D deficiency, unspecified: Secondary | ICD-10-CM | POA: Diagnosis not present

## 2019-02-20 DIAGNOSIS — E559 Vitamin D deficiency, unspecified: Secondary | ICD-10-CM | POA: Diagnosis not present

## 2019-02-20 DIAGNOSIS — G720 Drug-induced myopathy: Secondary | ICD-10-CM | POA: Diagnosis not present

## 2019-02-20 DIAGNOSIS — Z79899 Other long term (current) drug therapy: Secondary | ICD-10-CM | POA: Diagnosis not present

## 2019-02-20 DIAGNOSIS — Z0000 Encounter for general adult medical examination without abnormal findings: Secondary | ICD-10-CM | POA: Diagnosis not present

## 2019-02-20 DIAGNOSIS — I10 Essential (primary) hypertension: Secondary | ICD-10-CM | POA: Diagnosis not present

## 2019-02-20 DIAGNOSIS — M810 Age-related osteoporosis without current pathological fracture: Secondary | ICD-10-CM | POA: Diagnosis not present

## 2019-02-20 DIAGNOSIS — D692 Other nonthrombocytopenic purpura: Secondary | ICD-10-CM | POA: Diagnosis not present

## 2019-02-20 DIAGNOSIS — E7800 Pure hypercholesterolemia, unspecified: Secondary | ICD-10-CM | POA: Diagnosis not present

## 2019-03-17 ENCOUNTER — Other Ambulatory Visit: Payer: Medicare Other

## 2019-04-20 DIAGNOSIS — H26493 Other secondary cataract, bilateral: Secondary | ICD-10-CM | POA: Diagnosis not present

## 2019-05-03 DIAGNOSIS — H26492 Other secondary cataract, left eye: Secondary | ICD-10-CM | POA: Diagnosis not present

## 2019-05-04 ENCOUNTER — Other Ambulatory Visit: Payer: Medicare Other

## 2019-05-05 DIAGNOSIS — R309 Painful micturition, unspecified: Secondary | ICD-10-CM | POA: Diagnosis not present

## 2019-05-05 DIAGNOSIS — N390 Urinary tract infection, site not specified: Secondary | ICD-10-CM | POA: Diagnosis not present

## 2019-05-24 DIAGNOSIS — H26491 Other secondary cataract, right eye: Secondary | ICD-10-CM | POA: Diagnosis not present

## 2019-06-12 ENCOUNTER — Ambulatory Visit
Admission: RE | Admit: 2019-06-12 | Discharge: 2019-06-12 | Disposition: A | Discharge status: Home / Self Care | Payer: Medicare Other | Source: Ambulatory Visit | Attending: Internal Medicine | Admitting: Internal Medicine

## 2019-06-12 ENCOUNTER — Other Ambulatory Visit: Payer: Self-pay

## 2019-06-12 DIAGNOSIS — M810 Age-related osteoporosis without current pathological fracture: Secondary | ICD-10-CM

## 2019-06-12 DIAGNOSIS — Z780 Asymptomatic menopausal state: Secondary | ICD-10-CM | POA: Diagnosis not present

## 2019-06-12 DIAGNOSIS — M8589 Other specified disorders of bone density and structure, multiple sites: Secondary | ICD-10-CM | POA: Diagnosis not present

## 2019-07-05 DIAGNOSIS — M810 Age-related osteoporosis without current pathological fracture: Secondary | ICD-10-CM | POA: Diagnosis not present

## 2019-08-03 DIAGNOSIS — Z23 Encounter for immunization: Secondary | ICD-10-CM | POA: Diagnosis not present

## 2019-10-17 ENCOUNTER — Other Ambulatory Visit: Payer: Self-pay | Admitting: Family Medicine

## 2019-10-17 DIAGNOSIS — Z1231 Encounter for screening mammogram for malignant neoplasm of breast: Secondary | ICD-10-CM

## 2019-11-02 DIAGNOSIS — E7800 Pure hypercholesterolemia, unspecified: Secondary | ICD-10-CM | POA: Diagnosis not present

## 2019-11-02 DIAGNOSIS — N390 Urinary tract infection, site not specified: Secondary | ICD-10-CM | POA: Diagnosis not present

## 2019-12-06 DIAGNOSIS — R351 Nocturia: Secondary | ICD-10-CM | POA: Diagnosis not present

## 2019-12-06 DIAGNOSIS — R350 Frequency of micturition: Secondary | ICD-10-CM | POA: Diagnosis not present

## 2019-12-08 ENCOUNTER — Other Ambulatory Visit: Payer: Self-pay

## 2019-12-08 ENCOUNTER — Ambulatory Visit
Admission: RE | Admit: 2019-12-08 | Discharge: 2019-12-08 | Disposition: A | Discharge status: Home / Self Care | Payer: Medicare Other | Source: Ambulatory Visit | Attending: Family Medicine | Admitting: Family Medicine

## 2019-12-08 DIAGNOSIS — Z1231 Encounter for screening mammogram for malignant neoplasm of breast: Secondary | ICD-10-CM

## 2019-12-11 DIAGNOSIS — R350 Frequency of micturition: Secondary | ICD-10-CM | POA: Diagnosis not present

## 2019-12-11 DIAGNOSIS — N281 Cyst of kidney, acquired: Secondary | ICD-10-CM | POA: Diagnosis not present

## 2020-01-04 DIAGNOSIS — Z7189 Other specified counseling: Secondary | ICD-10-CM | POA: Diagnosis not present

## 2020-01-04 DIAGNOSIS — Z5181 Encounter for therapeutic drug level monitoring: Secondary | ICD-10-CM | POA: Diagnosis not present

## 2020-01-04 DIAGNOSIS — M810 Age-related osteoporosis without current pathological fracture: Secondary | ICD-10-CM | POA: Diagnosis not present

## 2020-01-04 DIAGNOSIS — Z8639 Personal history of other endocrine, nutritional and metabolic disease: Secondary | ICD-10-CM | POA: Diagnosis not present

## 2020-01-17 DIAGNOSIS — M810 Age-related osteoporosis without current pathological fracture: Secondary | ICD-10-CM | POA: Diagnosis not present

## 2020-01-24 DIAGNOSIS — R351 Nocturia: Secondary | ICD-10-CM | POA: Diagnosis not present

## 2020-01-24 DIAGNOSIS — R350 Frequency of micturition: Secondary | ICD-10-CM | POA: Diagnosis not present

## 2020-03-05 DIAGNOSIS — D692 Other nonthrombocytopenic purpura: Secondary | ICD-10-CM | POA: Diagnosis not present

## 2020-03-05 DIAGNOSIS — Z0000 Encounter for general adult medical examination without abnormal findings: Secondary | ICD-10-CM | POA: Diagnosis not present

## 2020-03-05 DIAGNOSIS — Z79899 Other long term (current) drug therapy: Secondary | ICD-10-CM | POA: Diagnosis not present

## 2020-03-05 DIAGNOSIS — N390 Urinary tract infection, site not specified: Secondary | ICD-10-CM | POA: Diagnosis not present

## 2020-03-05 DIAGNOSIS — I10 Essential (primary) hypertension: Secondary | ICD-10-CM | POA: Diagnosis not present

## 2020-03-05 DIAGNOSIS — M545 Low back pain: Secondary | ICD-10-CM | POA: Diagnosis not present

## 2020-03-05 DIAGNOSIS — M810 Age-related osteoporosis without current pathological fracture: Secondary | ICD-10-CM | POA: Diagnosis not present

## 2020-03-05 DIAGNOSIS — Z8639 Personal history of other endocrine, nutritional and metabolic disease: Secondary | ICD-10-CM | POA: Diagnosis not present

## 2020-03-05 DIAGNOSIS — E7800 Pure hypercholesterolemia, unspecified: Secondary | ICD-10-CM | POA: Diagnosis not present

## 2020-04-09 DIAGNOSIS — N390 Urinary tract infection, site not specified: Secondary | ICD-10-CM | POA: Diagnosis not present

## 2020-07-05 DIAGNOSIS — T1490XA Injury, unspecified, initial encounter: Secondary | ICD-10-CM | POA: Diagnosis not present

## 2020-07-05 DIAGNOSIS — M79652 Pain in left thigh: Secondary | ICD-10-CM | POA: Diagnosis not present

## 2020-07-05 DIAGNOSIS — M545 Low back pain: Secondary | ICD-10-CM | POA: Diagnosis not present

## 2020-07-05 DIAGNOSIS — W109XXA Fall (on) (from) unspecified stairs and steps, initial encounter: Secondary | ICD-10-CM | POA: Diagnosis not present

## 2020-07-05 DIAGNOSIS — M7918 Myalgia, other site: Secondary | ICD-10-CM | POA: Diagnosis not present

## 2020-07-08 DIAGNOSIS — M5416 Radiculopathy, lumbar region: Secondary | ICD-10-CM | POA: Diagnosis not present

## 2020-07-26 DIAGNOSIS — Z23 Encounter for immunization: Secondary | ICD-10-CM | POA: Diagnosis not present

## 2020-08-02 DIAGNOSIS — M1612 Unilateral primary osteoarthritis, left hip: Secondary | ICD-10-CM | POA: Diagnosis not present

## 2020-09-13 DIAGNOSIS — M1611 Unilateral primary osteoarthritis, right hip: Secondary | ICD-10-CM | POA: Diagnosis not present

## 2020-09-13 DIAGNOSIS — Z23 Encounter for immunization: Secondary | ICD-10-CM | POA: Diagnosis not present

## 2020-09-26 DIAGNOSIS — Z7189 Other specified counseling: Secondary | ICD-10-CM | POA: Diagnosis not present

## 2020-10-07 DIAGNOSIS — M1611 Unilateral primary osteoarthritis, right hip: Secondary | ICD-10-CM | POA: Diagnosis not present

## 2020-10-09 NOTE — H&P (Signed)
HIP ARTHROPLASTY ADMISSION H&P  Patient ID: [name] MRN: [id number] DOB/AGE: 76-Aug-1947 76 y.o.  Chief Complaint: left hip pain.  Planned Procedure Date: 10/29/20 Medical Clearance by Ivin Booty   Cardiac Clearance by Stephanie Acre   HPI: [name] is a 73 y.o. 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 The patient has a history of pain and functional disability in the left hip due to arthritis and has failed non-surgical conservative treatments for greater than 12 weeks to include NSAID's and/or analgesics, corticosteriod injections and activity modification.  Onset of symptoms was abrupt, starting 1 years ago with rapidlly worsening course since that time. The patient noted no past surgery on the left hip.  Patient currently rates pain at 8 out of 10 with activity. Patient has night pain, worsening of pain with activity and weight bearing, pain that interferes with activities of daily living and pain with passive range of motion.  Patient has evidence of joint space narrowing by imaging studies.  There is no active infection.  Past Medical History:  Diagnosis Date  . Hypertension    Past Surgical History:  Procedure Laterality Date  . ABDOMINAL HYSTERECTOMY    . APPENDECTOMY    . CARPAL TUNNEL RELEASE  09/29/2011   Procedure: CARPAL TUNNEL RELEASE;  Surgeon: Cammie Sickle., MD;  Location: [facility];  Service: Orthopedics;  Laterality: Left;  left carpal tunnel release  . COLON SURGERY     2 perforated ulcers surgery  . ganglion cyst removed on foot    . HERNIA REPAIR     femoral inguinal hernia repair on right  . right hand surgery    . TRIGGER FINGER RELEASE  09/29/2011   Procedure: RELEASE TRIGGER FINGER/A-1 PULLEY;  Surgeon: Cammie Sickle., MD;  Location: [facility];  Service: Orthopedics;  Laterality: Left;  release a-1 pulley left thumb  . TRIGGER FINGER RELEASE  09/29/2011   Procedure: RELEASE TRIGGER FINGER/A-1 PULLEY;  Surgeon:  Cammie Sickle., MD;  Location: [facility];  Service: Orthopedics;  Laterality: Left;  Ring Finger  . vaginal delivery x 2     Allergies  Allergen Reactions  . Codeine   . Nsaids     Perforated ulcers  . Percocet [Oxycodone-Acetaminophen]   . Vicodin [Hydrocodone-Acetaminophen]    Prior to Admission medications   Medication Sig Start Date End Date Taking? Authorizing Provider  atenolol (TENORMIN) 50 MG tablet Take 50 mg by mouth at bedtime.      [provider]  cholecalciferol (VITAMIN D) 1000 UNITS tablet Take 1,000 Units by mouth daily. Takes 2000 units in one tab daily at night     [provider]  Multiple Vitamin (MULTIVITAMIN) capsule Take 1 capsule by mouth every morning.      [provider]  NIFEdipine (PROCARDIA XL/ADALAT-CC) 60 MG 24 hr tablet Take 60 mg by mouth at bedtime.      [provider]   Social History   Socioeconomic History  . Marital status: Divorced    Spouse name: Not on file  . Number of children: Not on file  . Years of education: Not on file  . Highest education level: Not on file  Occupational History  . Not on file  Tobacco Use  . Smoking status: Not on file  Substance and Sexual Activity  . Alcohol use: Not on file  . Drug use: Not on file  . Sexual activity: Not on file  Other Topics Concern  . Not on file  Social History Narrative  . Not on file   Social Determinants of Health   Financial Resource Strain:   . Difficulty of Paying Living Expenses: Not on file  Food Insecurity:   . Worried About Charity fundraiser in the Last Year: Not on file  . Ran Out of Food in the Last Year: Not on file  Transportation Needs:   . Lack of Transportation (Medical): Not on file  . Lack of Transportation (Non-Medical): Not on file  Physical Activity:   . Days of Exercise per Week: Not on file  . Minutes of Exercise per Session: Not on file  Stress:   . Feeling of Stress : Not on file  Social  Connections:   . Frequency of Communication with Friends and Family: Not on file  . Frequency of Social Gatherings with Friends and Family: Not on file  . Attends Religious Services: Not on file  . Active Member of Clubs or Organizations: Not on file  . Attends Archivist Meetings: Not on file  . Marital Status: Not on file   Family History  Problem Relation Age of Onset  . Breast cancer Neg Hx     ROS: Currently denies lightheadedness, dizziness, Fever, chills, CP, SOB.   No personal history of DVT, PE, MI, or CVA. No loose teeth or dentures All other systems have been reviewed and were otherwise currently negative with the exception of those mentioned in the HPI and as above.  Objective: Vitals: Ht: 61.5 Wt: 108.2 lbs Temp: 97.7 BP: 166/82 Pulse: 67 O2 98% on room air.   Physical Exam: General: AAO x 4, NAD.  HEENT: EOMI, trachea midline, Head AT/[place] Pulm: No increased work of breathing.  Clear B/L A/P w/o crackle or wheeze. CV: RRR, No m/g/r appreciated  GI: soft, NT, ND Neuro: Neuro without gross focal deficit.  Sensation intact distally Skin: No lesions in the area of chief complaint MSK/Surgical Site: Left Hip pain with passive ROM.  Positive Stinchfield.  5/5 strength.  NVI.    Imaging Review Plain radiographs demonstrate severe degenerative joint disease of the left hip.   The bone quality appears to be good for age and reported activity level.  Preoperative templating of the joint replacement has been completed, documented, and submitted to the Operating Room personnel in order to optimize intra-operative equipment management.  Assessment: OA LEFT HIP Active Problems:   * No active hospital problems. *   Plan: Plan for Procedure(s): TOTAL HIP ARTHROPLASTY ANTERIOR APPROACH  The patient history, physical exam, clinical judgement of the provider and imaging are consistent with end stage degenerative joint disease and total joint arthroplasty is deemed  medically necessary. The treatment options including medical management, injection therapy, and arthroplasty were discussed at length. The risks and benefits of Procedure(s): TOTAL HIP ARTHROPLASTY ANTERIOR APPROACH were presented and reviewed.  The risks of nonoperative treatment, versus surgical intervention including but not limited to continued pain, aseptic loosening, stiffness, dislocation/subluxation, infection, bleeding, nerve injury, blood clots, cardiopulmonary complications, morbidity, mortality, among others were discussed. The patient verbalizes understanding and wishes to proceed with the plan.  Patient is being admitted to observation for surgery, pain control, PT, prophylactic antibiotics, VTE prophylaxis, progressive ambulation, ADL's and discharge planning.   Dental prophylaxis discussed and recommended for 2 years postoperatively.   The patient does meet the criteria for TXA which will be used perioperatively.    Xarelto  will be used postoperatively  for DVT prophylaxis in addition to SCDs, and early ambulation.  Plan for Tramadol, Tylenol, for pain.  NOTHING for spasm.  Omeprazole for gastric protection.  The patient is planning to be discharged home with OPPT in care of her son Sloan Leiter, Hershal Coria 10/09/2020 8:23 AM

## 2020-10-21 NOTE — Care Plan (Signed)
Ortho Bundle Case Management Note  Patient Details  Name: ORTHA METTS MRN: [id number] Date of Birth: 07-07-1946  Met with patient and son in the office prior to surgery. She will discharge to home with him to assist. Rolling walker ordered and HHPT referral to [facility]. OPPT set up at [facility]. Patient and MD in agreement with plan and choice offered                    DME Arranged:  Walker rolling DME Agency:  [employer]  HH Arranged:  PT [place] Agency:  [facility] ([facility]  Additional Comments: Please contact me with any questions of if this plan should need to change.  [name],  [facility] Specialist  [phone] 10/21/2020, 10:40 AM

## 2020-10-23 ENCOUNTER — Encounter (HOSPITAL_COMMUNITY): Payer: Medicare Other

## 2020-10-23 NOTE — Progress Notes (Signed)
DUE TO COVID-19 ONLY ONE VISITOR IS ALLOWED TO COME WITH YOU AND STAY IN THE WAITING ROOM ONLY DURING PRE OP AND PROCEDURE DAY OF SURGERY. THE 1 VISITOR  MAY VISIT WITH YOU AFTER SURGERY IN YOUR PRIVATE ROOM DURING VISITING HOURS ONLY!  YOU NEED TO HAVE A COVID 19 TEST ON__12/01/2020 _____ @_______ , THIS TEST MUST BE DONE BEFORE SURGERY,  COVID TESTING SITE [address] [place] [address], IT IS ON THE RIGHT GOING OUT [address] APPROXIMATELY  2 MINUTES PAST [employer] ON THE RIGHT. ONCE YOUR COVID TEST IS COMPLETED,  PLEASE BEGIN THE QUARANTINE INSTRUCTIONS AS OUTLINED IN YOUR HANDOUT.                [name]  10/23/2020   Your procedure is scheduled on: 10/29/2020   Report to [facility] Main  Entrance   Report to admitting at    0530 AM     Call this number if you have problems the morning of surgery [phone]    REMEMBER: NO  SOLID FOOD CANDY OR GUM AFTER MIDNIGHT. CLEAR LIQUIDS UNTIL  0430am        . NOTHING BY MOUTH EXCEPT CLEAR LIQUIDS UNTIL    . PLEASE FINISH ENSURE DRINK PER SURGEON ORDER  WHICH NEEDS TO BE COMPLETED AT    0430am      CLEAR LIQUID DIET   Foods Allowed                                                                    Coffee and tea, regular and decaf                            Fruit ices (not with fruit pulp)                                      Iced Popsicles                                    Carbonated beverages, regular and diet                                    Cranberry, grape and apple juices Sports drinks like Gatorade Lightly seasoned clear broth or consume(fat free) Sugar, honey syrup ___________________________________________________________________      BRUSH YOUR TEETH MORNING OF SURGERY AND RINSE YOUR MOUTH OUT, NO CHEWING GUM CANDY OR MINTS.     Take these medicines the morning of surgery with A SIP OF WATER: none   DO NOT TAKE ANY DIABETIC MEDICATIONS DAY OF YOUR SURGERY                                You may not have any metal on your body including hair pins and              piercings  Do not wear jewelry, make-up, lotions,  powders or perfumes, deodorant             Do not wear nail polish on your fingernails.  Do not shave  48 hours prior to surgery.              Men may shave face and neck.   Do not bring valuables to the hospital. [place].  Contacts, dentures or bridgework may not be worn into surgery.  Leave suitcase in the car. After surgery it may be brought to your room.     Patients discharged the day of surgery will not be allowed to drive home. IF YOU ARE HAVING SURGERY AND GOING HOME THE SAME DAY, YOU MUST HAVE AN ADULT TO DRIVE YOU HOME AND BE WITH YOU FOR 24 HOURS. YOU MAY GO HOME BY TAXI OR UBER OR ORTHERWISE, BUT AN ADULT MUST ACCOMPANY YOU HOME AND STAY WITH YOU FOR 24 HOURS.  Name and phone number of your driver:  Special Instructions: N/A              Please read over the following fact sheets you were given: _____________________________________________________________________  [facility] - Preparing for Surgery Before surgery, you can play an important role.  Because skin is not sterile, your skin needs to be as free of germs as possible.  You can reduce the number of germs on your skin by washing with CHG (chlorahexidine gluconate) soap before surgery.  CHG is an antiseptic cleaner which kills germs and bonds with the skin to continue killing germs even after washing. Please DO NOT use if you have an allergy to CHG or antibacterial soaps.  If your skin becomes reddened/irritated stop using the CHG and inform your nurse when you arrive at Short Stay. Do not shave (including legs and underarms) for at least 48 hours prior to the first CHG shower.  You may shave your face/neck. Please follow these instructions carefully:  1.  Shower with CHG Soap the night before surgery and the  morning of Surgery.  2.   If you choose to wash your hair, wash your hair first as usual with your  normal  shampoo.  3.  After you shampoo, rinse your hair and body thoroughly to remove the  shampoo.                           4.  Use CHG as you would any other liquid soap.  You can apply chg directly  to the skin and wash                       Gently with a scrungie or clean washcloth.  5.  Apply the CHG Soap to your body ONLY FROM THE NECK DOWN.   Do not use on face/ open                           Wound or open sores. Avoid contact with eyes, ears mouth and genitals (private parts).                       Wash face,  Genitals (private parts) with your normal soap.             6.  Wash thoroughly, paying special attention to the  area where your surgery  will be performed.  7.  Thoroughly rinse your body with warm water from the neck down.  8.  DO NOT shower/wash with your normal soap after using and rinsing off  the CHG Soap.                9.  Pat yourself dry with a clean towel.            10.  Wear clean pajamas.            11.  Place clean sheets on your bed the night of your first shower and do not  sleep with pets. Day of Surgery : Do not apply any lotions/deodorants the morning of surgery.  Please wear clean clothes to the hospital/surgery center.  FAILURE TO FOLLOW THESE INSTRUCTIONS MAY RESULT IN THE CANCELLATION OF YOUR SURGERY PATIENT SIGNATURE_________________________________  NURSE SIGNATURE__________________________________  ________________________________________________________________________

## 2020-10-24 ENCOUNTER — Encounter (HOSPITAL_COMMUNITY)
Admission: RE | Admit: 2020-10-24 | Discharge: 2020-10-24 | Disposition: A | Discharge status: Home / Self Care | Payer: Medicare Other | Source: Ambulatory Visit | Attending: Orthopedic Surgery | Admitting: Orthopedic Surgery

## 2020-10-24 ENCOUNTER — Other Ambulatory Visit: Payer: Self-pay

## 2020-10-24 ENCOUNTER — Encounter (HOSPITAL_COMMUNITY): Payer: Self-pay

## 2020-10-24 DIAGNOSIS — Z01818 Encounter for other preprocedural examination: Secondary | ICD-10-CM | POA: Insufficient documentation

## 2020-10-24 HISTORY — DX: Unspecified osteoarthritis, unspecified site: M19.90

## 2020-10-24 LAB — BASIC METABOLIC PANEL
Anion gap: 11 (ref 5–15)
BUN: 18 mg/dL (ref 8–23)
CO2: 27 mmol/L (ref 22–32)
Calcium: 9.3 mg/dL (ref 8.9–10.3)
Chloride: 104 mmol/L (ref 98–111)
Creatinine, Ser: 1.2 mg/dL — ABNORMAL HIGH (ref 0.44–1.00)
GFR, Estimated: 47 mL/min — ABNORMAL LOW (ref >60)
Glucose, Bld: 106 mg/dL — ABNORMAL HIGH (ref 70–99)
Potassium: 3.7 mmol/L (ref 3.5–5.1)
Sodium: 142 mmol/L (ref 135–145)

## 2020-10-24 LAB — CBC
HCT: 42.1 % (ref 36.0–46.0)
Hemoglobin: 13.1 g/dL (ref 12.0–15.0)
MCH: 28.6 pg (ref 26.0–34.0)
MCHC: 31.1 g/dL (ref 30.0–36.0)
MCV: 91.9 fL (ref 80.0–100.0)
Platelets: 277 10*3/uL (ref 150–400)
RBC: 4.58 MIL/uL (ref 3.87–5.11)
RDW: 13.6 % (ref 11.5–15.5)
WBC: 6.3 10*3/uL (ref 4.0–10.5)
nRBC: 0 % (ref 0.0–0.2)

## 2020-10-24 LAB — SURGICAL PCR SCREEN
MRSA, PCR: NEGATIVE
Staphylococcus aureus: NEGATIVE

## 2020-10-24 NOTE — Progress Notes (Addendum)
Anesthesia Review:  PCP: DR Alessandra Grout - requested LOV note on 10/24/2020  Cardiologist : Chest x-ray : EKG : 10/24/2020  Echo : Stress test: Cardiac Cath :  Activity level: can do a flight of stairs without difficulty  Sleep Study/ CPAP :no  Fasting Blood Sugar :      / Checks Blood Sugar -- times a day:   Blood Thinner/ Instructions /Last Dose: ASA / Instructions/ Last Dose :  Blood pressure elevated at preop appt on 10/24/2020 .  Called and LVMM with Triage Nurse at [facility].  Made Roanna Banning aware.  B/P at preop appt on 10/24/20 was 193/69.

## 2020-10-25 ENCOUNTER — Other Ambulatory Visit (HOSPITAL_COMMUNITY)
Admission: RE | Admit: 2020-10-25 | Discharge: 2020-10-25 | Disposition: A | Discharge status: Home / Self Care | Payer: Medicare Other | Source: Ambulatory Visit | Attending: Orthopedic Surgery | Admitting: Orthopedic Surgery

## 2020-10-25 DIAGNOSIS — Z20822 Contact with and (suspected) exposure to covid-19: Secondary | ICD-10-CM | POA: Diagnosis not present

## 2020-10-25 DIAGNOSIS — Z01812 Encounter for preprocedural laboratory examination: Secondary | ICD-10-CM | POA: Insufficient documentation

## 2020-10-25 LAB — SARS CORONAVIRUS 2 (TAT 6-24 HRS): SARS Coronavirus 2: NEGATIVE

## 2020-10-28 NOTE — Progress Notes (Addendum)
Called son to verify that he did received metoprolol script from [facility] and pt had started on medication.  Zelphia Cairo, RN listed in on conversation via speakerphone.  Son stated that medication was actually Metoprolol 50 mg and script read to take 1/2 tablet two times daily and that his mother had just taken first dose.  Instructed son for mother to not take any more Metoprolol tonite but to take the 1/2 in am before arriving for surgery with a sip of water. Son, Prudence Heiny, voiced understanding.   Changed on Medication List in epic. Made Roanna Banning aware of above.  No new orders given.

## 2020-10-28 NOTE — Progress Notes (Signed)
Spoke with son and he stated script was at [facility] for metoprolol extended release of 50 mg daily.  He is driving from [place] to here and will pick up when he arrives in town later this afternoon.  Informed son that she should take dose of metoprolol today but no dose of Metoprolol in am per order of Roanna Banning.  Roanna Banning was made aware of dose and approximately when pt would be taking today on 10/28/2020  per Son, Atha Starks.  Pt son, Donja Tipping, voiced understanding.

## 2020-10-28 NOTE — Progress Notes (Signed)
Pt's son , Reeve Mallo, called and LVMM regarding mother's blood pressure at rpeop appt.  Called son , Lanasia Porras and spoke with him .  Son reports that blood pressure readsing at home since preop appt have been 150-160/80.  Dr Stephanie Acre office has been in touch with them regarding blood pressure readings.  Son reports mother getx anxious at MD appts and she has been off of b/p meds x 3-4 months.  Also reports mother has been in a lot of pain.  Son reports DR [employer] office called and stated they were going to start pt on Metoprolol and he was going to pick up script today.  Son states he was not going to start mother on b/p medication today .  Nurse informed son that she would speak with Roanna Banning and call him back.  Roanna Banning made aware of above . Roanna Banning stated for son and pt to start b/p medication today as prescribed and to also take day of surgery if Metoprolol.  Spoke with son an dinformed him .   Called [facility] at [facility] to get strength on b/p med to adsd to [employer] and they had no record of script being called intoday by office of Dr Stephanie Acre for pt.  Called son back and told him this.  Son to call office of Dr Stephanie Acre and find out what is going on and to call me back.

## 2020-10-28 NOTE — Anesthesia Preprocedure Evaluation (Addendum)
Anesthesia Evaluation  Patient identified by MRN, date of birth, ID band Patient awake    Reviewed: Allergy & Precautions, NPO status , Patient's Chart, lab work & pertinent test results  Airway Mallampati: II  TM Distance: >3 FB Neck ROM: Full    Dental no notable dental hx.    Pulmonary former smoker,    Pulmonary exam normal breath sounds clear to auscultation       Cardiovascular hypertension, Pt. on medications and Pt. on home beta blockers Normal cardiovascular exam Rhythm:Regular Rate:Normal  ECG: NSR, rate 60   Neuro/Psych negative neurological ROS  negative psych ROS   GI/Hepatic negative GI ROS, Neg liver ROS,   Endo/Other  negative endocrine ROS  Renal/GU negative Renal ROS     Musculoskeletal  (+) Arthritis ,   Abdominal   Peds  Hematology negative hematology ROS (+)   Anesthesia Other Findings OA LEFT HIP  Reproductive/Obstetrics                            Anesthesia Physical Anesthesia Plan  ASA: II  Anesthesia Plan: Spinal   Post-op Pain Management:    Induction: Intravenous  PONV Risk Score and Plan: 2 and Ondansetron, Dexamethasone, Propofol infusion and Treatment may vary due to age or medical condition  Airway Management Planned: Simple Face Mask  Additional Equipment:   Intra-op Plan:   Post-operative Plan:   Informed Consent: I have reviewed the patients History and Physical, chart, labs and discussed the procedure including the risks, benefits and alternatives for the proposed anesthesia with the patient or authorized representative who has indicated his/her understanding and acceptance.     Dental advisory given  Plan Discussed with: CRNA  Anesthesia Plan Comments:         Anesthesia Quick Evaluation

## 2020-10-29 ENCOUNTER — Ambulatory Visit (HOSPITAL_COMMUNITY): Payer: Medicare Other

## 2020-10-29 ENCOUNTER — Encounter (HOSPITAL_COMMUNITY): Payer: Self-pay | Admitting: Orthopedic Surgery

## 2020-10-29 ENCOUNTER — Encounter (HOSPITAL_COMMUNITY)
Admission: RE | Disposition: A | Discharge status: Skilled Nursing Facility | Payer: Self-pay | Source: Home / Self Care | Attending: Orthopedic Surgery

## 2020-10-29 ENCOUNTER — Inpatient Hospital Stay (HOSPITAL_COMMUNITY)
Admission: RE | Admit: 2020-10-29 | Discharge: 2020-11-06 | DRG: 470 | Disposition: A | Discharge status: Skilled Nursing Facility | Payer: Medicare Other | Attending: Orthopedic Surgery | Admitting: Orthopedic Surgery

## 2020-10-29 ENCOUNTER — Other Ambulatory Visit: Payer: Self-pay

## 2020-10-29 ENCOUNTER — Ambulatory Visit (HOSPITAL_COMMUNITY): Payer: Medicare Other | Admitting: Physician Assistant

## 2020-10-29 DIAGNOSIS — Z886 Allergy status to analgesic agent status: Secondary | ICD-10-CM | POA: Diagnosis not present

## 2020-10-29 DIAGNOSIS — Z96642 Presence of left artificial hip joint: Secondary | ICD-10-CM | POA: Diagnosis not present

## 2020-10-29 DIAGNOSIS — Z79899 Other long term (current) drug therapy: Secondary | ICD-10-CM

## 2020-10-29 DIAGNOSIS — M9669 Fracture of other bone following insertion of orthopedic implant, joint prosthesis, or bone plate: Secondary | ICD-10-CM | POA: Diagnosis not present

## 2020-10-29 DIAGNOSIS — R52 Pain, unspecified: Secondary | ICD-10-CM

## 2020-10-29 DIAGNOSIS — M1612 Unilateral primary osteoarthritis, left hip: Secondary | ICD-10-CM | POA: Diagnosis not present

## 2020-10-29 DIAGNOSIS — G8929 Other chronic pain: Secondary | ICD-10-CM | POA: Diagnosis not present

## 2020-10-29 DIAGNOSIS — G8918 Other acute postprocedural pain: Secondary | ICD-10-CM | POA: Diagnosis not present

## 2020-10-29 DIAGNOSIS — Z20822 Contact with and (suspected) exposure to covid-19: Secondary | ICD-10-CM | POA: Diagnosis not present

## 2020-10-29 DIAGNOSIS — Y658 Other specified misadventures during surgical and medical care: Secondary | ICD-10-CM | POA: Diagnosis not present

## 2020-10-29 DIAGNOSIS — I10 Essential (primary) hypertension: Secondary | ICD-10-CM | POA: Diagnosis not present

## 2020-10-29 DIAGNOSIS — Z885 Allergy status to narcotic agent status: Secondary | ICD-10-CM | POA: Diagnosis not present

## 2020-10-29 DIAGNOSIS — M549 Dorsalgia, unspecified: Secondary | ICD-10-CM | POA: Diagnosis not present

## 2020-10-29 DIAGNOSIS — Z471 Aftercare following joint replacement surgery: Secondary | ICD-10-CM | POA: Diagnosis not present

## 2020-10-29 DIAGNOSIS — Z96649 Presence of unspecified artificial hip joint: Secondary | ICD-10-CM

## 2020-10-29 DIAGNOSIS — Z419 Encounter for procedure for purposes other than remedying health state, unspecified: Secondary | ICD-10-CM

## 2020-10-29 DIAGNOSIS — Y793 Surgical instruments, materials and orthopedic devices (including sutures) associated with adverse incidents: Secondary | ICD-10-CM | POA: Diagnosis not present

## 2020-10-29 HISTORY — PX: TOTAL HIP ARTHROPLASTY: SHX124

## 2020-10-29 SURGERY — ARTHROPLASTY, HIP, TOTAL, ANTERIOR APPROACH
Anesthesia: Spinal | Site: Hip | Laterality: Left

## 2020-10-29 MED ORDER — ONDANSETRON HCL 4 MG/2ML IJ SOLN
4.0000 mg | Freq: Four times a day (QID) | INTRAMUSCULAR | Status: DC | PRN
  Administered 2020-10-30: 4 mg via INTRAVENOUS
  Filled 2020-10-29 (×2): qty 2

## 2020-10-29 MED ORDER — FENTANYL CITRATE (PF) 100 MCG/2ML IJ SOLN
INTRAMUSCULAR | Status: AC
  Filled 2020-10-29: qty 2

## 2020-10-29 MED ORDER — LACTATED RINGERS IV SOLN: INTRAVENOUS | Status: DC

## 2020-10-29 MED ORDER — ONDANSETRON HCL 4 MG/2ML IJ SOLN: 4.0000 mg | Freq: Once | INTRAMUSCULAR | Status: DC | PRN

## 2020-10-29 MED ORDER — EPHEDRINE SULFATE 50 MG/ML IJ SOLN
INTRAMUSCULAR | Status: DC | PRN
  Administered 2020-10-29: 15 mg via INTRAVENOUS
  Administered 2020-10-29 (×2): 10 mg via INTRAVENOUS
  Administered 2020-10-29: 15 mg via INTRAVENOUS

## 2020-10-29 MED ORDER — METOPROLOL TARTRATE 50 MG PO TABS: 50.0000 mg | ORAL_TABLET | Freq: Two times a day (BID) | ORAL | Status: DC

## 2020-10-29 MED ORDER — ONDANSETRON HCL 4 MG PO TABS
4.0000 mg | ORAL_TABLET | Freq: Four times a day (QID) | ORAL | Status: DC | PRN
  Filled 2020-10-29: qty 1

## 2020-10-29 MED ORDER — CALCIUM CARBONATE-VITAMIN D 600-400 MG-UNIT PO TABS: 2.0000 | ORAL_TABLET | Freq: Every day | ORAL | Status: DC

## 2020-10-29 MED ORDER — ORAL CARE MOUTH RINSE
15.0000 mL | Freq: Once | OROMUCOSAL | Status: AC
  Administered 2020-10-29: 15 mL via OROMUCOSAL

## 2020-10-29 MED ORDER — TRANEXAMIC ACID-NACL 1000-0.7 MG/100ML-% IV SOLN
1000.0000 mg | INTRAVENOUS | Status: AC
  Administered 2020-10-29: 1000 mg via INTRAVENOUS
  Filled 2020-10-29: qty 100

## 2020-10-29 MED ORDER — ACETAMINOPHEN 325 MG PO TABS: 325.0000 mg | ORAL_TABLET | Freq: Four times a day (QID) | ORAL | Status: DC | PRN

## 2020-10-29 MED ORDER — METOCLOPRAMIDE HCL 5 MG/ML IJ SOLN: 5.0000 mg | Freq: Three times a day (TID) | INTRAMUSCULAR | Status: DC | PRN

## 2020-10-29 MED ORDER — ONDANSETRON HCL 4 MG/2ML IJ SOLN
INTRAMUSCULAR | Status: DC | PRN
  Administered 2020-10-29: 4 mg via INTRAVENOUS

## 2020-10-29 MED ORDER — ACETAMINOPHEN 500 MG PO TABS
1000.0000 mg | ORAL_TABLET | Freq: Four times a day (QID) | ORAL | Status: DC | PRN
  Administered 2020-10-29 – 2020-11-06 (×4): 1000 mg via ORAL
  Filled 2020-10-29 (×4): qty 2

## 2020-10-29 MED ORDER — TRIMETHOPRIM 100 MG PO TABS
100.0000 mg | ORAL_TABLET | Freq: Every day | ORAL | Status: DC
  Administered 2020-10-29 – 2020-11-05 (×8): 100 mg via ORAL
  Filled 2020-10-29 (×8): qty 1

## 2020-10-29 MED ORDER — PROMETHAZINE HCL 25 MG/ML IJ SOLN: 12.5000 mg | Freq: Four times a day (QID) | INTRAMUSCULAR | Status: DC | PRN

## 2020-10-29 MED ORDER — TRAMADOL HCL 50 MG PO TABS
50.0000 mg | ORAL_TABLET | Freq: Four times a day (QID) | ORAL | Status: DC
  Administered 2020-10-30 – 2020-11-06 (×2): 50 mg via ORAL
  Filled 2020-10-29 (×3): qty 1

## 2020-10-29 MED ORDER — RIVAROXABAN 10 MG PO TABS
10.0000 mg | ORAL_TABLET | Freq: Every day | ORAL | Status: DC
  Administered 2020-10-30 – 2020-11-06 (×8): 10 mg via ORAL
  Filled 2020-10-29 (×8): qty 1

## 2020-10-29 MED ORDER — ONDANSETRON HCL 4 MG PO TABS: 4.0000 mg | ORAL_TABLET | Freq: Every day | ORAL | 0 refills | Status: AC | PRN

## 2020-10-29 MED ORDER — POLYETHYLENE GLYCOL 3350 17 G PO PACK: 17.0000 g | PACK | Freq: Every day | ORAL | Status: DC | PRN

## 2020-10-29 MED ORDER — PHENOL 1.4 % MT LIQD: 1.0000 | OROMUCOSAL | Status: DC | PRN

## 2020-10-29 MED ORDER — DOCUSATE SODIUM 100 MG PO CAPS
100.0000 mg | ORAL_CAPSULE | Freq: Two times a day (BID) | ORAL | Status: DC
  Administered 2020-10-29 – 2020-11-05 (×15): 100 mg via ORAL
  Filled 2020-10-29 (×15): qty 1

## 2020-10-29 MED ORDER — BUPIVACAINE LIPOSOME 1.3 % IJ SUSP
10.0000 mL | Freq: Once | INTRAMUSCULAR | Status: AC
  Administered 2020-10-29: 10 mL
  Filled 2020-10-29: qty 10

## 2020-10-29 MED ORDER — WATER FOR IRRIGATION, STERILE IR SOLN
Status: DC | PRN
  Administered 2020-10-29: 2000 mL

## 2020-10-29 MED ORDER — 0.9 % SODIUM CHLORIDE (POUR BTL) OPTIME
TOPICAL | Status: DC | PRN
  Administered 2020-10-29: 1000 mL

## 2020-10-29 MED ORDER — SODIUM CHLORIDE (PF) 0.9 % IJ SOLN
INTRAMUSCULAR | Status: AC
  Filled 2020-10-29: qty 50

## 2020-10-29 MED ORDER — CALCIUM CARBONATE-VITAMIN D 500-200 MG-UNIT PO TABS
2.0000 | ORAL_TABLET | Freq: Every day | ORAL | Status: DC
  Administered 2020-10-30 – 2020-11-06 (×8): 2 via ORAL
  Filled 2020-10-29 (×8): qty 2

## 2020-10-29 MED ORDER — SENNA 8.6 MG PO TABS: 1.0000 | ORAL_TABLET | Freq: Every day | ORAL | Status: DC | PRN

## 2020-10-29 MED ORDER — CEFAZOLIN SODIUM-DEXTROSE 2-4 GM/100ML-% IV SOLN
2.0000 g | Freq: Four times a day (QID) | INTRAVENOUS | Status: AC
  Administered 2020-10-29 (×2): 2 g via INTRAVENOUS
  Filled 2020-10-29 (×2): qty 100

## 2020-10-29 MED ORDER — ACETAMINOPHEN 500 MG PO TABS: 1000.0000 mg | ORAL_TABLET | Freq: Four times a day (QID) | ORAL | 0 refills | Status: AC | PRN

## 2020-10-29 MED ORDER — METOPROLOL SUCCINATE ER 50 MG PO TB24
50.0000 mg | ORAL_TABLET | Freq: Every day | ORAL | Status: DC
  Administered 2020-10-29 – 2020-11-05 (×6): 50 mg via ORAL
  Filled 2020-10-29 (×7): qty 1

## 2020-10-29 MED ORDER — MENTHOL 3 MG MT LOZG: 1.0000 | LOZENGE | OROMUCOSAL | Status: DC | PRN

## 2020-10-29 MED ORDER — METOCLOPRAMIDE HCL 5 MG PO TABS: 5.0000 mg | ORAL_TABLET | Freq: Three times a day (TID) | ORAL | Status: DC | PRN

## 2020-10-29 MED ORDER — OMEPRAZOLE 20 MG PO CPDR: 20.0000 mg | DELAYED_RELEASE_CAPSULE | Freq: Every day | ORAL | 0 refills | Status: AC

## 2020-10-29 MED ORDER — BUPIVACAINE IN DEXTROSE 0.75-8.25 % IT SOLN
INTRATHECAL | Status: DC | PRN
  Administered 2020-10-29: 1.8 mL via INTRATHECAL

## 2020-10-29 MED ORDER — DEXAMETHASONE SODIUM PHOSPHATE 10 MG/ML IJ SOLN
INTRAMUSCULAR | Status: DC | PRN
  Administered 2020-10-29: 5 mg via INTRAVENOUS

## 2020-10-29 MED ORDER — TRAMADOL HCL 50 MG PO TABS
50.0000 mg | ORAL_TABLET | Freq: Four times a day (QID) | ORAL | Status: DC | PRN
  Administered 2020-10-29: 50 mg via ORAL
  Filled 2020-10-29: qty 1

## 2020-10-29 MED ORDER — FENTANYL CITRATE (PF) 100 MCG/2ML IJ SOLN
INTRAMUSCULAR | Status: DC | PRN
  Administered 2020-10-29: 50 ug via INTRAVENOUS

## 2020-10-29 MED ORDER — MORPHINE SULFATE (PF) 2 MG/ML IV SOLN
0.5000 mg | INTRAVENOUS | Status: DC | PRN
  Administered 2020-10-29: 0.5 mg via INTRAVENOUS
  Administered 2020-10-29 – 2020-10-30 (×3): 1 mg via INTRAVENOUS
  Filled 2020-10-29 (×4): qty 1

## 2020-10-29 MED ORDER — TRAMADOL HCL 50 MG PO TABS
50.0000 mg | ORAL_TABLET | Freq: Four times a day (QID) | ORAL | 0 refills | Status: AC | PRN
Start: 2020-10-29 — End: 2020-11-03

## 2020-10-29 MED ORDER — PANTOPRAZOLE SODIUM 40 MG PO TBEC
40.0000 mg | DELAYED_RELEASE_TABLET | Freq: Every day | ORAL | Status: DC
  Administered 2020-10-30 – 2020-11-05 (×7): 40 mg via ORAL
  Filled 2020-10-29 (×7): qty 1

## 2020-10-29 MED ORDER — FENTANYL CITRATE (PF) 100 MCG/2ML IJ SOLN: 25.0000 ug | INTRAMUSCULAR | Status: DC | PRN

## 2020-10-29 MED ORDER — RIVAROXABAN 10 MG PO TABS: 10.0000 mg | ORAL_TABLET | Freq: Every day | ORAL | 0 refills | Status: AC

## 2020-10-29 MED ORDER — CHLORHEXIDINE GLUCONATE 0.12 % MT SOLN: 15.0000 mL | Freq: Once | OROMUCOSAL | Status: AC

## 2020-10-29 MED ORDER — ONDANSETRON HCL 4 MG PO TABS
4.0000 mg | ORAL_TABLET | Freq: Three times a day (TID) | ORAL | Status: DC | PRN
  Filled 2020-10-29: qty 1

## 2020-10-29 MED ORDER — TRANEXAMIC ACID-NACL 1000-0.7 MG/100ML-% IV SOLN
1000.0000 mg | Freq: Once | INTRAVENOUS | Status: AC
  Administered 2020-10-29: 1000 mg via INTRAVENOUS
  Filled 2020-10-29: qty 100

## 2020-10-29 MED ORDER — SODIUM CHLORIDE (PF) 0.9 % IJ SOLN
INTRAMUSCULAR | Status: DC | PRN
  Administered 2020-10-29: 20 mL

## 2020-10-29 MED ORDER — PROPOFOL 500 MG/50ML IV EMUL
INTRAVENOUS | Status: DC | PRN
  Administered 2020-10-29: 50 ug/kg/min via INTRAVENOUS

## 2020-10-29 MED ORDER — CEFAZOLIN SODIUM-DEXTROSE 2-4 GM/100ML-% IV SOLN
2.0000 g | INTRAVENOUS | Status: AC
  Administered 2020-10-29: 2 g via INTRAVENOUS
  Filled 2020-10-29: qty 100

## 2020-10-29 MED ORDER — MORPHINE SULFATE ER 15 MG PO TBCR
15.0000 mg | EXTENDED_RELEASE_TABLET | Freq: Two times a day (BID) | ORAL | Status: DC
  Administered 2020-10-29 – 2020-11-02 (×4): 15 mg via ORAL
  Filled 2020-10-29 (×7): qty 1

## 2020-10-29 MED ORDER — ACETAMINOPHEN 500 MG PO TABS
1000.0000 mg | ORAL_TABLET | Freq: Once | ORAL | Status: AC
  Administered 2020-10-29: 1000 mg via ORAL
  Filled 2020-10-29: qty 2

## 2020-10-29 MED ORDER — PHENYLEPHRINE HCL-NACL 10-0.9 MG/250ML-% IV SOLN
INTRAVENOUS | Status: DC | PRN
  Administered 2020-10-29: 80 ug/min via INTRAVENOUS

## 2020-10-29 SURGICAL SUPPLY — 48 items
APL PRP STRL LF DISP 70% ISPRP (MISCELLANEOUS) ×1
BAG SPEC THK2 15X12 ZIP CLS (MISCELLANEOUS)
BAG ZIPLOCK 12X15 (MISCELLANEOUS) IMPLANT
BLADE SAG 18X100X1.27 (BLADE) ×3 IMPLANT
BLADE SURG SZ10 CARB STEEL (BLADE) ×3 IMPLANT
CHLORAPREP W/TINT 26 (MISCELLANEOUS) ×3 IMPLANT
CLOSURE STERI-STRIP 1/2X4 (GAUZE/BANDAGES/DRESSINGS) ×2
CLSR STERI-STRIP ANTIMIC 1/2X4 (GAUZE/BANDAGES/DRESSINGS) ×4 IMPLANT
COVER PERINEAL POST (MISCELLANEOUS) ×3 IMPLANT
COVER SURGICAL LIGHT HANDLE (MISCELLANEOUS) ×3 IMPLANT
COVER WAND RF STERILE (DRAPES) ×3 IMPLANT
DECANTER SPIKE VIAL GLASS SM (MISCELLANEOUS) ×6 IMPLANT
DRAPE IMP U-DRAPE 54X76 (DRAPES) ×3 IMPLANT
DRAPE STERI IOBAN 125X83 (DRAPES) ×3 IMPLANT
DRAPE U-SHAPE 47X51 STRL (DRAPES) ×6 IMPLANT
DRSG MEPILEX BORDER 4X8 (GAUZE/BANDAGES/DRESSINGS) ×3 IMPLANT
ELECT REM PT RETURN 15FT ADLT (MISCELLANEOUS) ×3 IMPLANT
GLOVE BIO SURGEON STRL SZ7.5 (GLOVE) ×6 IMPLANT
GLOVE BIOGEL PI IND STRL 7.5 (GLOVE) ×1 IMPLANT
GLOVE BIOGEL PI IND STRL 8 (GLOVE) ×1 IMPLANT
GLOVE BIOGEL PI INDICATOR 7.5 (GLOVE) ×2
GLOVE BIOGEL PI INDICATOR 8 (GLOVE) ×2
GOWN STRL REUS W/TWL LRG LVL3 (GOWN DISPOSABLE) ×3 IMPLANT
GOWN STRL REUS W/TWL XL LVL3 (GOWN DISPOSABLE) ×3 IMPLANT
HEAD BIOLOX HIP 36/+2.5 (Joint) ×1 IMPLANT
HIP BIOLOX HD 36/+2.5 (Joint) ×3 IMPLANT
HOLDER FOLEY CATH W/STRAP (MISCELLANEOUS) ×3 IMPLANT
INSERT 0 DEGREE 36 (Miscellaneous) ×3 IMPLANT
KIT TURNOVER KIT A (KITS) IMPLANT
MANIFOLD NEPTUNE II (INSTRUMENTS) ×3 IMPLANT
NS IRRIG 1000ML POUR BTL (IV SOLUTION) ×3 IMPLANT
PACK ANTERIOR HIP CUSTOM (KITS) ×3 IMPLANT
PROTECTOR NERVE ULNAR (MISCELLANEOUS) ×3 IMPLANT
SCREW HEX LP 6.5X20 (Screw) ×6 IMPLANT
SHELL ACETAB TRIDENT 48 (Shell) ×3 IMPLANT
STEM HIP 4 127DEG (Stem) ×3 IMPLANT
SUT MNCRL AB 3-0 PS2 18 (SUTURE) ×3 IMPLANT
SUT STRATAFIX 0 PDS 27 VIOLET (SUTURE) ×3
SUT VIC AB 0 CT1 36 (SUTURE) ×3 IMPLANT
SUT VIC AB 1 CT1 36 (SUTURE) ×3 IMPLANT
SUT VIC AB 2-0 CT1 27 (SUTURE) ×6
SUT VIC AB 2-0 CT1 TAPERPNT 27 (SUTURE) ×2 IMPLANT
SUTURE STRATFX 0 PDS 27 VIOLET (SUTURE) ×1 IMPLANT
SYR BULB EAR ULCER 3OZ GRN STR (SYRINGE) ×3 IMPLANT
TRAY FOLEY MTR SLVR 14FR STAT (SET/KITS/TRAYS/PACK) ×3 IMPLANT
TRAY FOLEY MTR SLVR 16FR STAT (SET/KITS/TRAYS/PACK) IMPLANT
TUBE SUCTION HIGH CAP CLEAR NV (SUCTIONS) ×3 IMPLANT
WATER STERILE IRR 1000ML POUR (IV SOLUTION) ×6 IMPLANT

## 2020-10-29 NOTE — Anesthesia Postprocedure Evaluation (Signed)
Anesthesia Post Note  Patient: [name]  Procedure(s) Performed: TOTAL HIP ARTHROPLASTY ANTERIOR APPROACH (Left Hip)     Patient location during evaluation: PACU Anesthesia Type: Spinal Level of consciousness: oriented and awake and alert Pain management: pain level controlled Vital Signs Assessment: post-procedure vital signs reviewed and stable Respiratory status: spontaneous breathing, respiratory function stable and patient connected to nasal cannula oxygen Cardiovascular status: blood pressure returned to baseline and stable Postop Assessment: no headache, no backache, no apparent nausea or vomiting and spinal receding Anesthetic complications: no   No complications documented.  Last Vitals:  Vitals:   10/29/20 1534 10/29/20 1706  BP: (!) 170/100 (!) 178/84  Pulse: 75 67  Resp: 14 16  Temp: 36.5 C 36.4 C  SpO2: 100% 100%    Last Pain:  Vitals:   10/29/20 1902  TempSrc:   PainSc: 10-Worst pain ever                 Etola Mull P Marvin Maenza

## 2020-10-29 NOTE — Progress Notes (Signed)
Notified PA Margy Clarks that patient pain is not well controlled with meds. Morphine is not helping well and patient did have 300 ml emesis after taking tramadol. Order for pm MS cot. Ordered for night shift. Will continue to reposition and give pain meds and ice according to orders. Call bell within reach and son [name] at bedside.

## 2020-10-29 NOTE — Discharge Instructions (Signed)
You may bear weight as tolerated. Keep your dressing on and dry until follow up. Take medicine to prevent blood clots as directed. Take pain medicine as needed with the goal of transitioning to over the counter medicines.  If needed, you may increase breakthrough pain medication (Tramadol) for the first few days post op - up to 2 tablets every 4 hours.  Stop this medication as soon as you are able.  INSTRUCTIONS AFTER JOINT REPLACEMENT   o Remove items at home which could result in a fall. This includes throw rugs or furniture in walking pathways o ICE to the affected joint every three hours while awake for 30 minutes at a time, for at least the first 3-5 days, and then as needed for pain and swelling.  Continue to use ice for pain and swelling. You may notice swelling that will progress down to the foot and ankle.  This is normal after surgery.  Elevate your leg when you are not up walking on it.   o Continue to use the breathing machine you got in the hospital (incentive spirometer) which will help keep your temperature down.  It is common for your temperature to cycle up and down following surgery, especially at night when you are not up moving around and exerting yourself.  The breathing machine keeps your lungs expanded and your temperature down.   DIET:  As you were doing prior to hospitalization, we recommend a well-balanced diet.  DRESSING / WOUND CARE / SHOWERING  You may shower 3 days after surgery, but keep the wounds dry during showering.  You may use an occlusive plastic wrap (Press'n Seal for example) with blue painter's tape at edges, NO SOAKING/SUBMERGING IN THE BATHTUB.  If the bandage gets wet, change with a clean dry gauze.  If the incision gets wet, pat the wound dry with a clean towel.  ACTIVITY  o Increase activity slowly as tolerated, but follow the weight bearing instructions below.   o No driving for 6 weeks or until further direction given by your physician.  You  cannot drive while taking narcotics.  o No lifting or carrying greater than 10 lbs. until further directed by your surgeon. o Avoid periods of inactivity such as sitting longer than an hour when not asleep. This helps prevent blood clots.  o You may return to work once you are authorized by your doctor.     WEIGHT BEARING   Weight bearing as tolerated with assist device (walker, cane, etc) as directed, use it as long as suggested by your surgeon or therapist, typically at least 4-6 weeks.   EXERCISES  Results after joint replacement surgery are often greatly improved when you follow the exercise, range of motion and muscle strengthening exercises prescribed by your doctor. Safety measures are also important to protect the joint from further injury. Any time any of these exercises cause you to have increased pain or swelling, decrease what you are doing until you are comfortable again and then slowly increase them. If you have problems or questions, call your caregiver or physical therapist for advice.   Rehabilitation is important following a joint replacement. After just a few days of immobilization, the muscles of the leg can become weakened and shrink (atrophy).  These exercises are designed to build up the tone and strength of the thigh and leg muscles and to improve motion. Often times heat used for twenty to thirty minutes before working out will loosen up your tissues and help with  improving the range of motion but do not use heat for the first two weeks following surgery (sometimes heat can increase post-operative swelling).   These exercises can be done on a training (exercise) mat, on the floor, on a table or on a bed. Use whatever works the best and is most comfortable for you.    Use music or television while you are exercising so that the exercises are a pleasant break in your day. This will make your life better with the exercises acting as a break in your routine that you can look  forward to.   Perform all exercises about fifteen times, three times per day or as directed.  You should exercise both the operative leg and the other leg as well.  Exercises include:   . Quad Sets - Tighten up the muscle on the front of the thigh (Quad) and hold for 5-10 seconds.   . Straight Leg Raises - With your knee straight (if you were given a brace, keep it on), lift the leg to 60 degrees, hold for 3 seconds, and slowly lower the leg.  Perform this exercise against resistance later as your leg gets stronger.  . Leg Slides: Lying on your back, slowly slide your foot toward your buttocks, bending your knee up off the floor (only go as far as is comfortable). Then slowly slide your foot back down until your leg is flat on the floor again.  . Angel Wings: Lying on your back spread your legs to the side as far apart as you can without causing discomfort.  . Hamstring Strength:  Lying on your back, push your heel against the floor with your leg straight by tightening up the muscles of your buttocks.  Repeat, but this time bend your knee to a comfortable angle, and push your heel against the floor.  You may put a pillow under the heel to make it more comfortable if necessary.   A rehabilitation program following joint replacement surgery can speed recovery and prevent re-injury in the future due to weakened muscles. Contact your doctor or a physical therapist for more information on knee rehabilitation.    CONSTIPATION  Constipation is defined medically as fewer than three stools per week and severe constipation as less than one stool per week.  Even if you have a regular bowel pattern at home, your normal regimen is likely to be disrupted due to multiple reasons following surgery.  Combination of anesthesia, postoperative narcotics, change in appetite and fluid intake all can affect your bowels.   YOU MUST use at least one of the following options; they are listed in order of increasing strength  to get the job done.  They are all available over the counter, and you may need to use some, POSSIBLY even all of these options:    Drink plenty of fluids (prune juice may be helpful) and high fiber foods Colace 100 mg by mouth twice a day  Senokot for constipation as directed and as needed Dulcolax (bisacodyl), take with full glass of water  Miralax (polyethylene glycol) once or twice a day as needed.  If you have tried all these things and are unable to have a bowel movement in the first 3-4 days after surgery call either your surgeon or your primary doctor.    If you experience loose stools or diarrhea, hold the medications until you stool forms back up.  If your symptoms do not get better within 1 week or if they get worse,   check with your doctor.  If you experience "the worst abdominal pain ever" or develop nausea or vomiting, please contact the office immediately for further recommendations for treatment.   ITCHING:  If you experience itching with your medications, try taking only a single pain pill, or even half a pain pill at a time.  You can also use Benadryl over the counter for itching or also to help with sleep.   TED HOSE STOCKINGS:  Use stockings on both legs until for at least 2 weeks or as directed by physician office. They may be removed at night for sleeping.  MEDICATIONS:  See your medication summary on the "After Visit Summary" that nursing will review with you.  You may have some home medications which will be placed on hold until you complete the course of blood thinner medication.  It is important for you to complete the blood thinner medication as prescribed.  PRECAUTIONS:  If you experience chest pain or shortness of breath - call 911 immediately for transfer to the hospital emergency department.   If you develop a fever greater that 101 F, purulent drainage from wound, increased redness or drainage from wound, foul odor from the wound/dressing, or calf pain - CONTACT  YOUR SURGEON.                                                   FOLLOW-UP APPOINTMENTS:  If you do not already have a post-op appointment, please call the office for an appointment to be seen by your surgeon.  Guidelines for how soon to be seen are listed in your "After Visit Summary", but are typically between 1-4 weeks after surgery.  OTHER INSTRUCTIONS:  Dental Antibiotics:  In most cases prophylactic antibiotics for Dental procdeures after total joint surgery are not necessary.  Exceptions are as follows:  1. History of prior total joint infection  2. Severely immunocompromised (Organ Transplant, cancer chemotherapy, Rheumatoid biologic meds such as Humera)  3. Poorly controlled diabetes (A1C &gt; 8.0, blood glucose over 200)  If you have one of these conditions, contact your surgeon for an antibiotic prescription, prior to your dental procedure.   MAKE SURE YOU:  . Understand these instructions.  . Get help right away if you are not doing well or get worse.    Thank you for letting us be a part of your medical care team.  It is a privilege we respect greatly.  We hope these instructions will help you stay on track for a fast and full recovery!     

## 2020-10-29 NOTE — Transfer of Care (Signed)
Immediate Anesthesia Transfer of Care Note  Patient: [name]  Procedure(s) Performed: TOTAL HIP ARTHROPLASTY ANTERIOR APPROACH (Left Hip)  Patient Location: PACU  Anesthesia Type:Spinal  Level of Consciousness: sedated, patient cooperative and responds to stimulation  Airway & Oxygen Therapy: Patient Spontanous Breathing and Patient connected to face mask oxygen  Post-op Assessment: Report given to RN and Post -op Vital signs reviewed and stable  Post vital signs: Reviewed and stable  Last Vitals:  Vitals Value Taken Time  BP 116/65 10/29/20 0930  Temp    Pulse 65 10/29/20 0930  Resp 17 10/29/20 0930  SpO2 100 % 10/29/20 0930  Vitals shown include unvalidated device data.  Last Pain:  Vitals:   10/29/20 0924  TempSrc:   PainSc: (P) 0-No pain         Complications: No complications documented.

## 2020-10-29 NOTE — Evaluation (Signed)
Physical Therapy Evaluation Patient Details Name: [name] MRN: [id number] DOB: 1946/01/29 Today's Date: 10/29/2020   History of Present Illness  Patient is 74 y.o. female s/p Lt THA anterior approach on 10/29/20 with PMH significant for HTN, OA.  Clinical Impression  [name] is a 74 y.o. female POD 0 s/p Lt THA. Patient reports independence with mobility at baseline. Patient is now limited by functional impairments (see PT problem list below) and requires min-mod assist for bed mobility and transfers with RW. Patient was limited by pain this session and unable to progress to bed>hair transfer or gait training. Patient will benefit from continued skilled PT interventions to address impairments and progress towards PLOF. Acute PT will follow to progress mobility and stair training in preparation for safe discharge home.     Follow Up Recommendations Follow surgeon's recommendation for DC plan and follow-up therapies    Equipment Recommendations  Rolling walker with 5" wheels    Recommendations for Other Services       Precautions / Restrictions Precautions Precautions: Fall Restrictions Weight Bearing Restrictions: No Other Position/Activity Restrictions: WBAT      Mobility  Bed Mobility Overal bed mobility: Needs Assistance Bed Mobility: Supine to Sit;Sit to Supine     Supine to sit: Mod assist;HOB elevated Sit to supine: Mod assist;HOB elevated   General bed mobility comments: cues for use of bed rail and to walk LE's to EOB. Assist to mobilize Lt LE on/off EOB.     Transfers Overall transfer level: Needs assistance Equipment used: Rolling walker (2 wheeled) Transfers: Sit to/from Stand Sit to Stand: Min assist         General transfer comment: cues for technique with power up and assist to steady. pt c/o pain increase in Lt hip and returned to sit EOB.  Ambulation/Gait                Stairs            Wheelchair Mobility    Modified  Rankin (Stroke Patients Only)       Balance Overall balance assessment: Needs assistance Sitting-balance support: Feet supported;Bilateral upper extremity supported Sitting balance-Leahy Scale: Fair     Standing balance support: Bilateral upper extremity supported Standing balance-Leahy Scale: Poor                               Pertinent Vitals/Pain Pain Assessment: Faces Faces Pain Scale: Hurts whole lot Pain Location: Lt hip Pain Descriptors / Indicators: Discomfort;Aching;Guarding;Grimacing Pain Intervention(s): Limited activity within patient's tolerance;Monitored during session;Premedicated before session;Repositioned;Ice applied    Home Living Family/patient expects to be discharged to:: Private residence Living Arrangements: Alone Available Help at Discharge: Family Type of Home: House Home Access: Stairs to enter Entrance Stairs-Rails: Left Entrance Stairs-Number of Steps: 4 Home Layout: Two level Beatrice - single point;Bedside commode Additional Comments: pt is going to stay downstairs and her son is staying with her to assist    Prior Function Level of Independence: Independent               Hand Dominance        Extremity/Trunk Assessment   Upper Extremity Assessment Upper Extremity Assessment: Overall WFL for tasks assessed    Lower Extremity Assessment Lower Extremity Assessment: Generalized weakness;LLE deficits/detail LLE: Unable to fully assess due to pain LLE Sensation: WNL LLE Coordination: WNL    Cervical / Trunk Assessment Cervical / Trunk  Assessment: Normal  Communication   Communication: No difficulties  Cognition Arousal/Alertness: Awake/alert Behavior During Therapy: WFL for tasks assessed/performed Overall Cognitive Status: Within Functional Limits for tasks assessed                                        General Comments      Exercises     Assessment/Plan    PT Assessment  Patient needs continued PT services  PT Problem List Decreased strength;Decreased range of motion;Decreased activity tolerance;Decreased balance;Decreased mobility;Decreased knowledge of use of DME;Decreased knowledge of precautions;Pain       PT Treatment Interventions DME instruction;Gait training;Stair training;Functional mobility training;Therapeutic activities;Therapeutic exercise;Balance training;Patient/family education    PT Goals (Current goals can be found in the Care Plan section)  Acute Rehab PT Goals Patient Stated Goal: stop hurting PT Goal Formulation: With patient Time For Goal Achievement: 11/05/20 Potential to Achieve Goals: Good    Frequency 7X/week   Barriers to discharge        Co-evaluation               AM-PAC PT "6 Clicks" Mobility  Outcome Measure Help needed turning from your back to your side while in a flat bed without using bedrails?: A Little Help needed moving from lying on your back to sitting on the side of a flat bed without using bedrails?: A Lot Help needed moving to and from a bed to a chair (including a wheelchair)?: A Lot Help needed standing up from a chair using your arms (e.g., wheelchair or bedside chair)?: A Little Help needed to walk in hospital room?: A Lot Help needed climbing 3-5 steps with a railing? : A Lot 6 Click Score: 14    End of Session Equipment Utilized During Treatment: Gait belt Activity Tolerance: Patient limited by pain Patient left: in bed;with call bell/phone within reach;with family/visitor present;with SCD's reapplied Nurse Communication: Mobility status PT Visit Diagnosis: Muscle weakness (generalized) (M62.81);Difficulty in walking, not elsewhere classified (R26.2);Pain Pain - Right/Left: Left Pain - part of body: Hip    Time: 4888-9169 PT Time Calculation (min) (ACUTE ONLY): 26 min   Charges:   PT Evaluation $PT Eval Low Complexity: 1 Low PT Treatments $Therapeutic Activity: 8-22 mins         Verner Mould, DPT Acute Rehabilitation Services  Office [phone] Pager [phone]  10/29/2020 5:43 PM

## 2020-10-29 NOTE — Interval H&P Note (Signed)
History and Physical Interval Note:  10/29/2020 6:24 AM  [name]  has presented today for surgery, with the diagnosis of OA LEFT HIP.  The various methods of treatment have been discussed with the patient and family. After consideration of risks, benefits and other options for treatment, the patient has consented to  Procedure(s): TOTAL HIP ARTHROPLASTY ANTERIOR APPROACH (Left) as a surgical intervention.  The patient's history has been reviewed, patient examined, no change in status, stable for surgery.  I have reviewed the patient's chart and labs.  Questions were answered to the patient's satisfaction.     Renette Butters

## 2020-10-29 NOTE — Op Note (Signed)
10/29/2020  10:21 AM  PATIENT:  [name]   MRN: [id number]  PRE-OPERATIVE DIAGNOSIS:  OA LEFT HIP  POST-OPERATIVE DIAGNOSIS:  OA LEFT HIP  PROCEDURE:  Procedure(s): TOTAL HIP ARTHROPLASTY ANTERIOR APPROACH  PREOPERATIVE INDICATIONS:    [name] is an 74 y.o. female who has a diagnosis of <principal problem not specified> and elected for surgical management after failing conservative treatment.  The risks benefits and alternatives were discussed with the patient including but not limited to the risks of nonoperative treatment, versus surgical intervention including infection, bleeding, nerve injury, periprosthetic fracture, the need for revision surgery, dislocation, leg length discrepancy, blood clots, cardiopulmonary complications, morbidity, mortality, among others, and they were willing to proceed.     OPERATIVE REPORT     SURGEON:   Renette Butters, MD    ASSISTANT:  Margy Clarks, PA-C, he was present and scrubbed throughout the case, critical for completion in a timely fashion, and for retraction, instrumentation, and closure.     ANESTHESIA:  General    COMPLICATIONS:  None.     COMPONENTS:  Stryker acolade fit femur size 4 with a 36 mm -2.5 head ball and an acetabular shell size 48 with a  polyethylene liner    PROCEDURE IN DETAIL:   The patient was met in the holding area and  identified.  The appropriate hip was identified and marked at the operative site.  The patient was then transported to the OR  and  placed under anesthesia per that record.  At that point, the patient was  placed in the supine position and  secured to the operating room table and all bony prominences padded. He received pre-operative antibiotics    The operative lower extremity was prepped from the iliac crest to the distal leg.  Sterile draping was performed.  Time out was performed prior to incision.      Skin incision was made just 2 cm lateral to the ASIS  extending in line with  the tensor fascia lata. Electrocautery was used to control all bleeders. I dissected down sharply to the fascia of the tensor fascia lata was confirmed that the muscle fibers beneath were running posteriorly. I then incised the fascia over the superficial tensor fascia lata in line with the incision. The fascia was elevated off the anterior aspect of the muscle the muscle was retracted posteriorly and protected throughout the case. I then used electrocautery to incise the tensor fascia lata fascia control and all bleeders. Immediately visible was the fat over top of the anterior neck and capsule.  I removed the anterior fat from the capsule and elevated the rectus muscle off of the anterior capsule. I then removed a large time of capsule. The retractors were then placed over the anterior acetabulum as well as around the superior and inferior neck.  I then made a femoral neck cut. Then used the power corkscrew to remove the femoral head from the acetabulum and thoroughly irrigated the acetabulum. I sized the femoral head.    I then exposed the deep acetabulum, cleared out any tissue including the ligamentum teres.   After adequate visualization, I excised the labrum, and then sequentially reamed.  I then impacted the acetabular implant into place using fluoroscopy for guidance.  Appropriate version and inclination was confirmed clinically matching their bony anatomy, and with fluoroscopy.  I placed a 20 mm screw in the posterior/superio position with an excellent bite.    I then placed the polyethylene liner in  place  I then adducted the leg and released the external rotators from the posterior femur allowing it to be easily delivered up lateral and anterior to the acetabulum for preparation of the femoral canal.    I then prepared the proximal femur using the cookie-cutter and then sequentially reamed and broached.  A trial broach, neck, and head was utilized, and I reduced the hip and used  floroscopy to assess the neck length and femoral implant.  I then impacted the femoral prosthesis into place into the appropriate version. The hip was then reduced and fluoroscopy confirmed appropriate position. Leg lengths were restored.  I then irrigated the hip copiously again with, and repaired the fascia with Vicryl, followed by monocryl for the subcutaneous tissue, Monocryl for the skin, Steri-Strips and sterile gauze. The patient was then awakened and returned to PACU in stable and satisfactory condition. There were no complications.  POST OPERATIVE PLAN: WBAT, DVT px: SCD's/TED, ambulation and chemical dvt px  Edmonia Lynch, MD Orthopedic Surgeon [phone]

## 2020-10-29 NOTE — Progress Notes (Signed)
PT Cancellation Note  Patient Details Name: [name] MRN: [id number] DOB: Aug 13, 1946   Cancelled Treatment:    Reason Eval/Treat Not Completed: Pain limiting ability to participate Attempted PT eval-pt reports increased pain at this time. She declines working with PT at this moment. She is awaiting pain meds from RN. Will check back as able.    [facility]  Office: [phone] Pager: [phone]

## 2020-10-29 NOTE — Anesthesia Procedure Notes (Signed)
Spinal  Patient location during procedure: OR Start time: 10/29/2020 7:25 AM End time: 10/29/2020 7:30 AM Staffing Performed: anesthesiologist  Anesthesiologist: Murvin Natal, MD Preanesthetic Checklist Completed: patient identified, IV checked, risks and benefits discussed, surgical consent, monitors and equipment checked, pre-op evaluation and timeout performed Spinal Block Patient position: sitting Prep: DuraPrep Patient monitoring: cardiac monitor, continuous pulse ox and blood pressure Approach: midline Location: L4-5 Injection technique: single-shot Needle Needle type: Pencan  Needle gauge: 24 G Needle length: 9 cm Assessment Sensory level: T10 Additional Notes Functioning IV was confirmed and monitors were applied. Sterile prep and drape, including hand hygiene and sterile gloves were used. The patient was positioned and the spine was prepped. The skin was anesthetized with lidocaine.  Free flow of clear CSF was obtained prior to injecting local anesthetic into the CSF.  The spinal needle aspirated freely following injection.  The needle was carefully withdrawn.  The patient tolerated the procedure well.

## 2020-10-30 DIAGNOSIS — T8484XD Pain due to internal orthopedic prosthetic devices, implants and grafts, subsequent encounter: Secondary | ICD-10-CM | POA: Diagnosis not present

## 2020-10-30 DIAGNOSIS — S32492A Other specified fracture of left acetabulum, initial encounter for closed fracture: Secondary | ICD-10-CM | POA: Diagnosis not present

## 2020-10-30 DIAGNOSIS — Z471 Aftercare following joint replacement surgery: Secondary | ICD-10-CM | POA: Diagnosis not present

## 2020-10-30 DIAGNOSIS — G3184 Mild cognitive impairment, so stated: Secondary | ICD-10-CM | POA: Diagnosis not present

## 2020-10-30 DIAGNOSIS — Z885 Allergy status to narcotic agent status: Secondary | ICD-10-CM | POA: Diagnosis not present

## 2020-10-30 DIAGNOSIS — M1380 Other specified arthritis, unspecified site: Secondary | ICD-10-CM | POA: Diagnosis not present

## 2020-10-30 DIAGNOSIS — I10 Essential (primary) hypertension: Secondary | ICD-10-CM | POA: Diagnosis present

## 2020-10-30 DIAGNOSIS — R2681 Unsteadiness on feet: Secondary | ICD-10-CM | POA: Diagnosis not present

## 2020-10-30 DIAGNOSIS — Y658 Other specified misadventures during surgical and medical care: Secondary | ICD-10-CM | POA: Diagnosis not present

## 2020-10-30 DIAGNOSIS — M549 Dorsalgia, unspecified: Secondary | ICD-10-CM | POA: Diagnosis present

## 2020-10-30 DIAGNOSIS — J90 Pleural effusion, not elsewhere classified: Secondary | ICD-10-CM | POA: Diagnosis not present

## 2020-10-30 DIAGNOSIS — R41841 Cognitive communication deficit: Secondary | ICD-10-CM | POA: Diagnosis not present

## 2020-10-30 DIAGNOSIS — Z111 Encounter for screening for respiratory tuberculosis: Secondary | ICD-10-CM | POA: Diagnosis not present

## 2020-10-30 DIAGNOSIS — M6281 Muscle weakness (generalized): Secondary | ICD-10-CM | POA: Diagnosis not present

## 2020-10-30 DIAGNOSIS — Z20822 Contact with and (suspected) exposure to covid-19: Secondary | ICD-10-CM | POA: Diagnosis present

## 2020-10-30 DIAGNOSIS — G8918 Other acute postprocedural pain: Secondary | ICD-10-CM | POA: Diagnosis not present

## 2020-10-30 DIAGNOSIS — Y793 Surgical instruments, materials and orthopedic devices (including sutures) associated with adverse incidents: Secondary | ICD-10-CM | POA: Diagnosis not present

## 2020-10-30 DIAGNOSIS — G8929 Other chronic pain: Secondary | ICD-10-CM | POA: Diagnosis present

## 2020-10-30 DIAGNOSIS — S32492D Other specified fracture of left acetabulum, subsequent encounter for fracture with routine healing: Secondary | ICD-10-CM | POA: Diagnosis not present

## 2020-10-30 DIAGNOSIS — K219 Gastro-esophageal reflux disease without esophagitis: Secondary | ICD-10-CM | POA: Diagnosis not present

## 2020-10-30 DIAGNOSIS — M25552 Pain in left hip: Secondary | ICD-10-CM | POA: Diagnosis not present

## 2020-10-30 DIAGNOSIS — M1612 Unilateral primary osteoarthritis, left hip: Secondary | ICD-10-CM | POA: Diagnosis present

## 2020-10-30 DIAGNOSIS — M9669 Fracture of other bone following insertion of orthopedic implant, joint prosthesis, or bone plate: Secondary | ICD-10-CM | POA: Diagnosis not present

## 2020-10-30 DIAGNOSIS — Z299 Encounter for prophylactic measures, unspecified: Secondary | ICD-10-CM | POA: Diagnosis not present

## 2020-10-30 DIAGNOSIS — Z886 Allergy status to analgesic agent status: Secondary | ICD-10-CM | POA: Diagnosis not present

## 2020-10-30 DIAGNOSIS — R52 Pain, unspecified: Secondary | ICD-10-CM | POA: Diagnosis not present

## 2020-10-30 DIAGNOSIS — Z79899 Other long term (current) drug therapy: Secondary | ICD-10-CM | POA: Diagnosis not present

## 2020-10-30 DIAGNOSIS — Z96642 Presence of left artificial hip joint: Secondary | ICD-10-CM | POA: Diagnosis not present

## 2020-10-30 MED ORDER — HYDRALAZINE HCL 10 MG PO TABS
10.0000 mg | ORAL_TABLET | Freq: Three times a day (TID) | ORAL | Status: DC
  Administered 2020-10-30 – 2020-11-06 (×14): 10 mg via ORAL
  Filled 2020-10-30 (×23): qty 1

## 2020-10-30 MED ORDER — HYDRALAZINE HCL 20 MG/ML IJ SOLN
5.0000 mg | Freq: Once | INTRAMUSCULAR | Status: AC
  Administered 2020-10-30: 5 mg via INTRAVENOUS
  Filled 2020-10-30: qty 1

## 2020-10-30 NOTE — TOC Transition Note (Signed)
Transition of Care [facility]) - CM/SW Discharge Note   Patient Details  Name: [name] MRN: [id number] Date of Birth: 03/01/1946  Transition of Care [facility]) CM/SW Contact:  Lia Hopping, [place] Phone Number: 10/30/2020, 11:48 AM   Clinical Narrative:    [name] of Care. See Ortho CM note.  RW delivered to the pt. Bedside by [employer]   Final next level of care: OP Rehab Barriers to Discharge: Barriers Resolved   Patient Goals and CMS Choice        Discharge Placement                       Discharge Plan and Services                DME Arranged: Walker rolling DME Agency: [employer]       HH Arranged: PT [place] Agency: [facility] [employer] [facility] [employer]        Social Determinants of Health ([place]) Interventions     Readmission Risk Interventions No flowsheet data found.

## 2020-10-30 NOTE — Progress Notes (Signed)
    Subjective: Patient reports pain as severe.  Tolerating diet.  Urinating.  +Flatus.  No CP, SOB.  Not yet OOB. Unfortunately pt. Is very sensitive to narcotics and she gets quite nauseated. This occurred yesterday with taking tramadol. Nausea eased with medications. However, this inability to tolerate narcotics has left her in quite a bit of pain.   Objective:   VITALS:   Vitals:   10/29/20 1706 10/29/20 2234 10/30/20 0124 10/30/20 0503  BP: (!) 178/84 (!) 184/91 (!) 181/85 (!) 181/87  Pulse: 67 68 71 71  Resp: 16 16 16    Temp: 97.6 F (36.4 C) (!) 97.3 F (36.3 C) (!) 97.5 F (36.4 C)   TempSrc:  Oral Oral   SpO2: 100% 98% 98% 100%  Weight: 48.5 kg     Height: 5\' 2"  (1.575 m)      CBC Latest Ref Rng & Units 10/24/2020 09/29/2011 08/07/2011  WBC 4.0 - 10.5 K/uL 6.3 - -  Hemoglobin 12.0 - 15.0 g/dL 13.1 13.3 13.0  Hematocrit 36 - 46 % 42.1 - -  Platelets 150 - 400 K/uL 277 - -   BMP Latest Ref Rng & Units 10/24/2020 08/06/2011 08/21/2009  Glucose 70 - 99 mg/dL 106(H) 100(H) 105(H)  BUN 8 - 23 mg/dL 18 24(H) 23  Creatinine 0.44 - 1.00 mg/dL 1.20(H) 1.12(H) 1.20  Sodium 135 - 145 mmol/L 142 135 141  Potassium 3.5 - 5.1 mmol/L 3.7 3.8 4.1  Chloride 98 - 111 mmol/L 104 101 106  CO2 22 - 32 mmol/L 27 26 27   Calcium 8.9 - 10.3 mg/dL 9.3 9.4 9.4   Intake/Output      12/07 0701 - 12/08 0700 12/08 0701 - 12/09 0700   P.O. 550    I.V. (mL/kg) 1500 (30.9)    Other 0    IV Piggyback 200    Total Intake(mL/kg) 2250 (46.4)    Urine (mL/kg/hr) 2300 (2)    Blood 150    Total Output 2450    Net -200            Physical Exam: General: NAD. Resting in bed.  Resp: No increased wob Cardio: regular rate and rhythm ABD soft Neurologically intact MSK Neurovascularly intact Sensation intact distally Intact pulses distally Dorsiflexion/Plantar flexion intact Incision: dressing C/D/I Neurovascular intact Dorsiflexion/Plantar flexion intact No cellulitis present Compartment  soft  Assessment: 1 Day Post-Op  S/P Procedure(s) (LRB): TOTAL HIP ARTHROPLASTY ANTERIOR APPROACH (Left) by Dr. Ernesta Amble. Murphy on 10/29/20  Active Problems:   S/P total hip arthroplasty  ADDITIONAL DIAGNOSIS:   Hypertension  Primary osteoarthritis, status post total hip arthroplasty Current HTN probably 2/2 pain.   Plan:  Up with therapy D/C IV fluids Incentive Spirometry Apply ice Weight Bearing: Weight Bearing as Tolerated (WBAT) LLE  Dressings: Maintain Mepilex.   VTE prophylaxis: Xarelto 10mg  Daily, SCDs, ambulation HTN: most likely due to pain. Pt. Was given hydralazine last night which did not seem to do much.  Giving her 10mg  IV this AM to see how she responds.   Beta blockers are probably contraindicated due to her pulse bing in the low 70's/high 60's D/C foley after PT evaluation this morning.  Dispo: Home; OPPT vs HPPT    Rachael Fee, PA-C 10/30/2020, 8:10 AM

## 2020-10-30 NOTE — Progress Notes (Signed)
PT Cancellation Note  Patient Details Name: [name] MRN: [id number] DOB: December 20, 1945   Cancelled Treatment:    Reason Eval/Treat Not Completed: Other (comment) Pt's son in hallway and requested therapy allow pt to sleep as she is finally able to sleep after having difficulty with nausea and pain this morning.  Will check back.   Lawton Dollinger,KATHrine E 10/30/2020, 11:27 AM Arlyce Dice, DPT Acute Rehabilitation Services Pager: [phone] Office: [phone]

## 2020-10-30 NOTE — Progress Notes (Signed)
Physical Therapy Treatment Patient Details Name: [name] MRN: [id number] DOB: 03-17-1946 Today's Date: 10/30/2020    History of Present Illness Patient is 74 y.o. female s/p Lt THA anterior approach on 10/29/20 with PMH significant for HTN, OA.    PT Comments    Pt requiring step by step multimodal cues for technique with sit to stands.  Pt performed 4 sit to stands and then assisted pt with pivoting to recliner (encouraging OOB).  Pt reports pain limiting her mobility however pt also having difficulty comprehending simple commands with multimodal cues (question decreased cognition today).  Pt also has steps to enter home.  Pt does not appear to be ready for d/c home today due to pain, limited mobility, and current assist level.  Pt had regular RW delivered to room, and now that pt has been practicing standing, pt would benefit from youth RW (RN notified).   Follow Up Recommendations  Follow surgeon's recommendation for DC plan and follow-up therapies     Equipment Recommendations  Rolling walker with 5" wheels (youth)    Recommendations for Other Services       Precautions / Restrictions Precautions Precautions: Fall Restrictions Other Position/Activity Restrictions: WBAT    Mobility  Bed Mobility Overal bed mobility: Needs Assistance Bed Mobility: Rolling;Sidelying to Sit Rolling: Mod assist Sidelying to sit: Mod assist       General bed mobility comments: pt reports back pain from being in bed so provided a pillow between knees for pt to roll and then perform sidelying to sit; pt required assist for weakness and due to pain  Transfers Overall transfer level: Needs assistance Equipment used: Rolling walker (2 wheeled) Transfers: Sit to/from [employer] Sit to Stand: Mod assist;Min assist;+2 safety/equipment Stand pivot transfers: Mod assist;+2 safety/equipment       General transfer comment: multimodal cues for technique; repeated cues for hand  placement and then pushing down through RW to assist with pain control; pt having difficulty comprehending simple commands; performed sit to stands x4 for technique; pt states she can't walk however encouraged OOB to recliner and assisted with pivoting and controlling descent  Ambulation/Gait                 Stairs             Wheelchair Mobility    Modified Rankin (Stroke Patients Only)       Balance                                            Cognition Arousal/Alertness: Awake/alert Behavior During Therapy: Flat affect Overall Cognitive Status: Impaired/Different from baseline Area of Impairment: Following commands;Problem solving;Memory;Safety/judgement                     Memory: Decreased short-term memory Following Commands: Follows one step commands inconsistently Safety/Judgement: Decreased awareness of safety   Problem Solving: Slow processing;Decreased initiation;Requires verbal cues;Requires tactile cues;Difficulty sequencing        Exercises      General Comments        Pertinent Vitals/Pain Pain Assessment: Faces Faces Pain Scale: Hurts even more Pain Location: Lt hip/thigh Pain Descriptors / Indicators: Discomfort;Aching;Guarding;Grimacing Pain Intervention(s): Monitored during session;Repositioned    Home Living                      Prior Function  PT Goals (current goals can now be found in the care plan section) Progress towards PT goals: Progressing toward goals    Frequency    7X/week      PT Plan Current plan remains appropriate    Co-evaluation              AM-PAC PT "6 Clicks" Mobility   Outcome Measure  Help needed turning from your back to your side while in a flat bed without using bedrails?: A Lot Help needed moving from lying on your back to sitting on the side of a flat bed without using bedrails?: A Lot Help needed moving to and from a bed to a chair  (including a wheelchair)?: A Lot Help needed standing up from a chair using your arms (e.g., wheelchair or bedside chair)?: A Lot Help needed to walk in hospital room?: A Lot Help needed climbing 3-5 steps with a railing? : Total 6 Click Score: 11    End of Session Equipment Utilized During Treatment: Gait belt Activity Tolerance: Patient limited by pain Patient left: in chair;with call bell/phone within reach;with chair alarm set;with family/visitor present Nurse Communication: Mobility status PT Visit Diagnosis: Muscle weakness (generalized) (M62.81);Difficulty in walking, not elsewhere classified (R26.2);Pain Pain - Right/Left: Left Pain - part of body: Hip     Time: 8676-1950 PT Time Calculation (min) (ACUTE ONLY): 37 min  Charges:  $Therapeutic Activity: 23-37 mins                     Jannette Spanner PT, DPT Acute Rehabilitation Services Pager: [phone] Office: [phone]  York Ram E 10/30/2020, 3:56 PM

## 2020-10-31 ENCOUNTER — Encounter (HOSPITAL_COMMUNITY): Payer: Self-pay | Admitting: Orthopedic Surgery

## 2020-10-31 NOTE — Progress Notes (Signed)
Physical Therapy Treatment Patient Details Name: [name] MRN: [id number] DOB: 13-Jun-1946 Today's Date: 10/31/2020    History of Present Illness Patient is 74 y.o. female s/p Lt THA anterior approach on 10/29/20 with PMH significant for HTN, OA.    PT Comments    POD # 2 pm (session part 2) Per Dr Percell Miller phone conversation......Marland Kitchenhe prefers D/C plans remain HH and he will keep her another day.   Pt seen this afternoon.  OOB in recliner. General transfer comment: pt required + 2 side by side assist to rise from recliner with difficulty processing VC's given to her about hand placement, upright posture and present with severe posterior lean and inability to self achieve midline stance.  Performed several sit to stands and still required MAX ASSIST.  "I'm falling"  Present with increased anxiety and delayed correction.  Unable to tolerate weight shift to L LE due to not pain but more fear. General Gait Details: Attempted gait again + 2 assist side by side pt was barely able to progress to 3 feet.  Inability to coordinate weight shift and great difficulty advancing L LE.  Required 75% VC's on proper hand placement on walker, upright posture, sequencing and prersent with a severe RIGHT POSTERIOR lean.  Son prerent and observed.  Gait limited by cognitive issues of coordination, facilitation and initiation. (? pain meds)  HIGH FALL RISK.    Son present during both sessions and observered.   Will see twice tomorrow.     Follow Up Recommendations  SNF     Equipment Recommendations  Rolling walker with 5" wheels (youth)    Recommendations for Other Services       Precautions / Restrictions Precautions Precautions: Fall Restrictions Weight Bearing Restrictions: No Other Position/Activity Restrictions: WBAT    Mobility  Bed Mobility   General bed mobility comments: OOB in recliner  Transfers Overall transfer level: Needs assistance Equipment used: Rolling walker (2 wheeled) Transfers:  Sit to/from [employer] Transfers Sit to Stand: +2 physical assistance;+2 safety/equipment;From elevated surface;Max assist Stand pivot transfers: Max assist;Total assist;+2 physical assistance;+2 safety/equipment       General transfer comment: pt required + 2 side by side assist to rise from recliner with difficulty processing VC's given to her about hand placement, upright posture and present with severe posterior lean and inability to self achieve midline stance.  Performed several sit to stands and still required MAX ASSIST.  "I'm falling"  Present with increased anxiety and delayed correction.  Unable to tolerate weight shift to L LE due to not pain but more fear.  Ambulation/Gait Ambulation/Gait assistance: Max assist;+2 physical assistance;+2 safety/equipment Gait Distance (Feet): 3 Feet Assistive device: Rolling walker (2 wheeled) Gait Pattern/deviations: Step-to pattern;Narrow base of support;Decreased stance time - left;Leaning posteriorly Gait velocity: decreased   General Gait Details: Attempted gait again + 2 assist side by side pt was barely able to progress to 3 feet.  Inability to coordinate weight shift and great difficulty advancing L LE.  Required 75% VC's on proper hand placement on walker, upright posture, sequencing and prersent with a severe RIGHT POSTERIOR lean.  Son prerent and observed.  Gait limited by cognitive issues of coordination, facilitation and initiation. (? pain meds)  HIGH FALL RISK   Stairs             Wheelchair Mobility    Modified Rankin (Stroke Patients Only)       Balance  Cognition Arousal/Alertness: Awake/alert   Overall Cognitive Status: Impaired/Different from baseline Area of Impairment: Following commands;Problem solving;Memory;Safety/judgement                               General Comments: AxO x 3 very pleasant lady retired Pharmacist, hospital however  impaired safety cognition, MAX anxiety and fear.  "I can't", "I'm falling" not much improvement her second PT session      Exercises   Total Hip Replacement TE's following HEP Handout 10 reps ankle pumps 05 reps knee presses 05 reps heel slides AAROM 05 reps SAQ's 05 reps ABD Instructed how to use a belt loop to assist  Followed by ICE     General Comments        Pertinent Vitals/Pain Pain Assessment: Faces Faces Pain Scale: Hurts even more Pain Location: Lt hip/thigh Pain Descriptors / Indicators: Discomfort;Aching;Guarding;Grimacing Pain Intervention(s): Monitored during session;Premedicated before session;Repositioned;Ice applied    Home Living                      Prior Function            PT Goals (current goals can now be found in the care plan section) Progress towards PT goals: Progressing toward goals    Frequency    7X/week      PT Plan Current plan remains appropriate    Co-evaluation              AM-PAC PT "6 Clicks" Mobility   Outcome Measure  Help needed turning from your back to your side while in a flat bed without using bedrails?: A Lot Help needed moving from lying on your back to sitting on the side of a flat bed without using bedrails?: A Lot Help needed moving to and from a bed to a chair (including a wheelchair)?: Total Help needed standing up from a chair using your arms (e.g., wheelchair or bedside chair)?: Total Help needed to walk in hospital room?: Total Help needed climbing 3-5 steps with a railing? : Total 6 Click Score: 8    End of Session Equipment Utilized During Treatment: Gait belt Activity Tolerance: Patient limited by pain;Other (comment) (cognition/processing) Patient left: in chair;with call bell/phone within reach;with chair alarm set;with family/visitor present Nurse Communication: Mobility status PT Visit Diagnosis: Muscle weakness (generalized) (M62.81);Difficulty in walking, not elsewhere  classified (R26.2);Pain Pain - Right/Left: Left Pain - part of body: Hip     Time: 8110-3159 PT Time Calculation (min) (ACUTE ONLY): 26 min  Charges:  $Gait Training: 8-22 mins $Therapeutic Exercise: 8-22 mins                     {Deloyd Handy  PTA Acute  Rehabilitation Services Pager      [phone] Office      [phone]

## 2020-10-31 NOTE — Progress Notes (Signed)
Physical Therapy Treatment Patient Details Name: [name] MRN: [id number] DOB: 1946/01/30 Today's Date: 10/31/2020    History of Present Illness Patient is 74 y.o. female s/p Lt THA anterior approach on 10/29/20 with PMH significant for HTN, OA.    PT Comments    POD # 2 am session withheld per family to allow pt to rest Pm session pt seen.  Son present and in room during session. Pt AxO x 3 however impaired safety cognition, slow processing, difficulty following instructions, MAX anxiety and fear.  "I can't", "I'm falling" Assisted OOB to amb to bathroom.  General bed mobility comments: first attempted to have pt self move to EOB using strap for L LE however pt was unable to coordinate the process and perseverated about not current pain but more about causing more pain.  Pt required 75% VC's step by step and increased time to partially complete.  Pt only 20% self completed.  Required MAX Assist and use of bed pad to complete scooting.  Once seated EOB pt present with severe posterior lean and falling backward.  Poor self right correction to midline.  Required Mod Assist for support.  Pt sat EOB x 3 min while we waiting on + 2 assist.  Fearful of falling and quick to say "I can't".  General transfer comment: pt was unable to self rise even from an elevated surface.  Required + 2 MAX side by side assist with 75% VC's on proper hand placement and upright posture.  Increased anxiety and fear pt present with poor balance and severe posterior lean.  Pt repeated "I can't".  Also assisted with a toilet transfer which also required + 2 Max Assist.  General Gait Details: Very difficult gait requiring + 2 Max Assist.  Pt very fearfully and present with severe posterior lean and difficulty advanceing L LE.  Only amb to bathroom and back.  Very unsteady and HIGH FALL RISK. Pt lives home alone and plans to D/C back home with help from her 2 son's however pt is NOT progressing enough to safely return home even  with family support.  Consulted evaluating LPT, reported pt's current mobility level.   Pt will need ST Rehab at SNF.  Son was hoping for home and pt wants to go home however she is not progressing well enough to safely D/C to home.   Follow Up Recommendations  SNF     Equipment Recommendations  Rolling walker with 5" wheels (youth)    Recommendations for Other Services       Precautions / Restrictions Precautions Precautions: Fall Restrictions Weight Bearing Restrictions: No Other Position/Activity Restrictions: WBAT    Mobility  Bed Mobility Overal bed mobility: Needs Assistance Bed Mobility: Rolling;Sidelying to Sit     Supine to sit: Max assist     General bed mobility comments: first attempted to have pt self move to EOB using strap for L LE however pt was unable to coordinate the process and perseverated about not current pain but more about causing more pain.  Pt required 75% VC's step by step and increased time to partially complete.  Pt only 20% self completed.  Required MAX Assist and use of bed pad to complete scooting.  Once seated EOB pt present with severe posterior lean and falling backward.  Poor self right correction to midline.  Required Mod Assist for support.  Pt sat EOB x 3 min while we waiting on + 2 assist.  Fearful of falling and quick to say "  I can't".  Transfers   Equipment used: Rolling walker (2 wheeled) Transfers: Sit to/from [employer] to Stand: +2 physical assistance;+2 safety/equipment;From elevated surface;Max assist Stand pivot transfers: Max assist;Total assist;+2 physical assistance;+2 safety/equipment       General transfer comment: pt was unable to self rise even from an elevated surface.  Required + 2 MAX side by side assist with 75% VC's on proper hand placement and upright posture.  Increased anxiety and fear pt present with poor balance and severe posterior lean.  Pt repeated "I can't".  Also assisted with a toilet  transfer which also required + 2 Max Assist.  Ambulation/Gait Ambulation/Gait assistance: Max assist;+2 physical assistance;+2 safety/equipment Gait Distance (Feet): 16 Feet (8 feet x 2 to and from bathroom) Assistive device: Rolling walker (2 wheeled) Gait Pattern/deviations: Step-to pattern;Narrow base of support;Decreased stance time - left;Leaning posteriorly Gait velocity: decreased   General Gait Details: Very difficult gait requiring + 2 Max Assist.  Pt very fearfully and present with severe posterior lean and difficulty advanceing L LE.  Only amb to bathroom and back.  Very unsteady and HIGH FALL RISK.   Stairs             Wheelchair Mobility    Modified Rankin (Stroke Patients Only)       Balance                                            Cognition Arousal/Alertness: Awake/alert   Overall Cognitive Status: Impaired/Different from baseline Area of Impairment: Following commands;Problem solving;Memory;Safety/judgement                               General Comments: AxO x 3 however impaired safety cognition, MAX anxiety and fear.  "I can't", "I'm falling"      Exercises      General Comments        Pertinent Vitals/Pain Pain Assessment: Faces Faces Pain Scale: Hurts whole lot Pain Location: Lt hip/thigh Pain Descriptors / Indicators: Discomfort;Aching;Guarding;Grimacing Pain Intervention(s): Monitored during session;Premedicated before session;Repositioned;Ice applied    Home Living                      Prior Function            PT Goals (current goals can now be found in the care plan section) Progress towards PT goals: Progressing toward goals    Frequency    7X/week      PT Plan Current plan remains appropriate    Co-evaluation              AM-PAC PT "6 Clicks" Mobility   Outcome Measure  Help needed turning from your back to your side while in a flat bed without using bedrails?: A  Lot Help needed moving from lying on your back to sitting on the side of a flat bed without using bedrails?: A Lot Help needed moving to and from a bed to a chair (including a wheelchair)?: Total Help needed standing up from a chair using your arms (e.g., wheelchair or bedside chair)?: Total Help needed to walk in hospital room?: Total Help needed climbing 3-5 steps with a railing? : Total 6 Click Score: 8    End of Session Equipment Utilized During Treatment: Gait belt Activity Tolerance: Patient limited by  pain;Other (comment) (cognition/processing) Patient left: in chair;with call bell/phone within reach;with chair alarm set;with family/visitor present Nurse Communication: Mobility status PT Visit Diagnosis: Muscle weakness (generalized) (M62.81);Difficulty in walking, not elsewhere classified (R26.2);Pain Pain - Right/Left: Left Pain - part of body: Hip     Time: 1400-1425 PT Time Calculation (min) (ACUTE ONLY): 25 min  Charges:  $Gait Training: 8-22 mins $Therapeutic Activity: 8-22 mins                     Rica Koyanagi  PTA Acute  Rehabilitation Services Pager      [phone] Office      [phone]

## 2020-10-31 NOTE — Progress Notes (Signed)
    Subjective: Patient reports pain as mild.  Tolerating diet.  Urinating.  +Flatus.  No CP, SOB.  OOB with PT yesterday. Yesterday pt. Slept a lot and was dealing with pain control issues as well as come confusion during PT>  Objective:   VITALS:   Vitals:   10/30/20 0857 10/30/20 1405 10/30/20 2039 10/31/20 0505  BP: 125/68 (!) 141/87 (!) 168/76 (!) 169/88  Pulse:  71 70 87  Resp:   16 16  Temp:   98.1 F (36.7 C) 98.1 F (36.7 C)  TempSrc:   Oral   SpO2:  98% 99% 98%  Weight:      Height:       CBC Latest Ref Rng & Units 10/24/2020 09/29/2011 08/07/2011  WBC 4.0 - 10.5 K/uL 6.3 - -  Hemoglobin 12.0 - 15.0 g/dL 13.1 13.3 13.0  Hematocrit 36.0 - 46.0 % 42.1 - -  Platelets 150 - 400 K/uL 277 - -   BMP Latest Ref Rng & Units 10/24/2020 08/06/2011 08/21/2009  Glucose 70 - 99 mg/dL 106(H) 100(H) 105(H)  BUN 8 - 23 mg/dL 18 24(H) 23  Creatinine 0.44 - 1.00 mg/dL 1.20(H) 1.12(H) 1.20  Sodium 135 - 145 mmol/L 142 135 141  Potassium 3.5 - 5.1 mmol/L 3.7 3.8 4.1  Chloride 98 - 111 mmol/L 104 101 106  CO2 22 - 32 mmol/L 27 26 27   Calcium 8.9 - 10.3 mg/dL 9.3 9.4 9.4   Intake/Output      12/08 0701 12/09 0700 12/09 0701 12/10 0700   P.O. 240    I.V. (mL/kg) 0 (0)    Other     IV Piggyback     Total Intake(mL/kg) 240 (4.9)    Urine (mL/kg/hr) 400 (0.3)    Blood     Total Output 400    Net -160         Emesis Occurrence 2 x       Physical Exam: General: NAD. Resting in bed.  Resp: No increased wob Cardio: regular rate and rhythm ABD soft Neurologically intact MSK Neurovascularly intact Sensation intact distally Intact pulses distally Dorsiflexion/Plantar flexion intact Incision: dressing C/D/I Neurovascular intact Dorsiflexion/Plantar flexion intact No cellulitis present Compartment soft  Assessment: 2 Days Post-Op  S/P Procedure(s) (LRB): TOTAL HIP ARTHROPLASTY ANTERIOR APPROACH (Left) by Dr. Ernesta Amble. Murphy on 10/29/20  Active Problems:   S/P total  hip arthroplasty  ADDITIONAL DIAGNOSIS:   Hypertension  Primary osteoarthritis, status post total hip arthroplasty Current HTN probably 2/2 pain.   Plan:  Up with therapy D/C IV fluids Incentive Spirometry Apply ice Weight Bearing: Weight Bearing as Tolerated (WBAT) LLE  Dressings: Maintain Mepilex.   VTE prophylaxis: Xarelto 10mg  Daily, SCDs, ambulation HTN: pt. States that she lives a little high.  PT/OT eval this morning.  Dispo: Home; with HHPT hopefully this morning.    Rachael Fee, PA-C 10/31/2020, 7:13 AM

## 2020-11-01 NOTE — Care Plan (Signed)
Dr Percell Miller and I spoke with patient's son this am. Plan is for her to stay one more night and work with therapy. Son wants her to go home and not to SNF. All post care set up as planned. He understands that she will need constant encouragement and reminders to get up and walk. Reminders that her hip is ok and she can use it.   Will continue to follow   [name], [place]

## 2020-11-01 NOTE — Progress Notes (Signed)
Physical Therapy Treatment Patient Details Name: [name] MRN: [id number] DOB: 07-09-1946 Today's Date: 11/01/2020    History of Present Illness Patient is 74 y.o. female s/p Lt THA anterior approach on 10/29/20 with PMH significant for HTN, OA.    PT Comments    POD # 3 pm session This session I had Son "hands on" assist his Mom to educate him on safe handling and better prepare how to assist at home. General transfer comment: Instructed Son "hands on" how to assist his mom with transfers and amb.  Instructed on proper hand placeemnt for him to prevent pt posterior LOB and to assist her on her LEFT side.  Son assisted pt from recliner with 50% VC's to pt on proper "push up from chair" and "lean forward".  Pt having difficulty coordinating her balance repeating "I'm falling backward".  Had Son "push" her forward from behind during sit to stand.  Pt required increased assist to safely stand to sit wanting to sit to early due to fatigue.  Required physical assist to guide hips otherwise would miss chair. General Gait Details: slight improvement from this morning.  Still requires heavy "hands on" Assist.  Had Son assist under Therapist direction on proper hand placement to support his mother as well as correct posterior lean.  Still present with cognitive delay and impaired initiation.  Pt was able to amb a total of 28 feet but required 3 seated rest breaks.  First she amb 6 feet, then 12 feet, then 8 feet with seated rest breaks between as recliner was following closely behind. Pt required 75% VC's on "walker....step....step" (repeat)  Pt asked the same questions repeatably.  "How long does the pain last?" and "what do you want me to do?" Returned to room in reliner and performed some TE's following HEP handout. Son following along.  Instructed on proper tech, freq as well as use of ICE.  Pt required increased time to perform.   Pt plans to stay another day.     Follow Up Recommendations  Home  health PT (plan is to stay a few extra days then D/C to home with 24/7 family support)     Equipment Recommendations  Rolling walker with 5" wheels (youth)    Recommendations for Other Services       Precautions / Restrictions Precautions Precautions: Fall Restrictions Weight Bearing Restrictions: No Other Position/Activity Restrictions: WBAT    Mobility  Bed Mobility Overal bed mobility: Needs Assistance     Sidelying to sit: Mod assist       General bed mobility comments: OOB in recliner  Transfers Overall transfer level: Needs assistance Equipment used: Rolling walker (2 wheeled) Transfers: Sit to/from [facility] Sit to Stand: Mod assist;Max assist Stand pivot transfers: Max assist       General transfer comment: Instructed Son "hands on" how to assist his mom with transfers and amb.  Instructed on proper hand placeemnt for him to prevent pt posterior LOB and to assist her on her LEFT side.  Son assisted pt from recliner with 50% VC's to pt on proper "push up from chair" and "lean forward".  Pt having difficulty coordinating her balance repeating "I'm falling backward".  Had Son "push" her forward from behind during sit to stand.  Pt required increased assist to safely stand to sit wanting to sit to early due to fatigue.  Required physical assist to guide hips otherwise would miss chair.  Ambulation/Gait Ambulation/Gait assistance: Max assist;+2 physical assistance;+2 safety/equipment  Gait Distance (Feet): 26 Feet (6', 12', 8') Assistive device: Rolling walker (2 wheeled) Gait Pattern/deviations: Step-to pattern;Narrow base of support;Decreased stance time - left;Leaning posteriorly Gait velocity: decreased   General Gait Details: slight improvement from this morning.  Still requires heavy "hands on" Assist.  Had Son assist under Therapist direction on proper hand placement to support his mother as well as correct posterior lean.  Still present with  cognitive delay and impaired initiation.  Pt was able to amb a total of 28 feet but required 3 seated rest breaks.  First she amb 6 feet, then 12 feet, then 8 feet with seated rest breaks between as recliner was following closely behind. Pt required 75% VC's on "walker....step....step" (repeat)  Pt asked the same questions repeatably.  "How long does the pain last?" and "what do you want me to do?"   Stairs             Wheelchair Mobility    Modified Rankin (Stroke Patients Only)       Balance                                            Cognition Arousal/Alertness: Awake/alert Behavior During Therapy:  (pleasant) Overall Cognitive Status: Impaired/Different from baseline Area of Impairment: Following commands;Problem solving;Memory;Safety/judgement                     Memory: Decreased short-term memory Following Commands: Follows one step commands inconsistently Safety/Judgement: Decreased awareness of safety   Problem Solving: Slow processing;Decreased initiation;Requires verbal cues;Requires tactile cues;Difficulty sequencing General Comments: 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  Repeats "how long does the pain last?"      Exercises   Total Hip Replacement TE's following HEP Handout 10 reps ankle pumps 10 reps knee presses 10 reps heel slides 10 reps SAQ's 10  reps ABD Instructed how to use a belt loop to assist  Followed by ICE     General Comments        Pertinent Vitals/Pain Pain Assessment: Faces Faces Pain Scale: Hurts even more Pain Location: Lt hip/thigh Pain Descriptors / Indicators: Discomfort;Aching;Guarding;Grimacing Pain Intervention(s): Monitored during session;Repositioned;Ice applied    Home Living                      Prior Function            PT Goals (current goals can now be found in the care plan section) Progress towards PT goals:  Progressing toward goals    Frequency    7X/week      PT Plan Current plan remains appropriate    Co-evaluation              AM-PAC PT "6 Clicks" Mobility   Outcome Measure  Help needed turning from your back to your side while in a flat bed without using bedrails?: A Lot Help needed moving from lying on your back to sitting on the side of a flat bed without using bedrails?: A Lot Help needed moving to and from a bed to a chair (including a wheelchair)?: A Lot Help needed standing up from a chair using your arms (e.g., wheelchair or bedside chair)?: A Lot Help needed to walk in hospital room?: A Lot Help needed climbing 3-5 steps with a railing? : Total 6  Click Score: 11    End of Session Equipment Utilized During Treatment: Gait belt Activity Tolerance: Other (comment) (limited by cognitive deficts) Patient left: in chair;with call bell/phone within reach;with chair alarm set;with family/visitor present Nurse Communication: Mobility status PT Visit Diagnosis: Muscle weakness (generalized) (M62.81);Difficulty in walking, not elsewhere classified (R26.2);Pain Pain - Right/Left: Left Pain - part of body: Hip     Time: 1330-1420 PT Time Calculation (min) (ACUTE ONLY): 50 min  Charges:  $Gait Training: 8-22 mins $Therapeutic Exercise: 8-22 mins $Therapeutic Activity: 8-22 mins                     Rica Koyanagi  PTA Acute  Rehabilitation Services Pager      [phone] Office      [phone]

## 2020-11-01 NOTE — Plan of Care (Signed)
  Problem: Education: Goal: Knowledge of General Education information will improve Description: Including pain rating scale, medication(s)/side effects and non-pharmacologic comfort measures Outcome: Progressing   Problem: Activity: Goal: Risk for activity intolerance will decrease Outcome: Progressing   Problem: Pain Managment: Goal: General experience of comfort will improve Outcome: Progressing   

## 2020-11-01 NOTE — Progress Notes (Signed)
Physical Therapy Treatment Patient Details Name: [name] MRN: [id number] DOB: 18-Nov-1946 Today's Date: 11/01/2020    History of Present Illness Patient is 74 y.o. female s/p Lt THA anterior approach on 10/29/20 with PMH significant for HTN, OA.    PT Comments    POD # 3 am session Pt appears more sharpe. 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  Repeats "how long does the pain last?"  Assisted OOB to amb. General bed mobility comments: Required increased time, assist with L LE and extra assist to complete scooting to EOB General transfer comment: pt required + 2 side by side assist and 75% VC's on proper hand placement to push off vs pull up on walker.  Right after giving instruction pt repeats "what do I do?".  Pt required repeat simple functional VC's on safety with turn completion and upright stance.  Still present with delayed initiation and severe posterior lean with no self correction other than stating "I'm falling backwards".  Therapist had to provide "buttock" suppport with turns to avoid missing target(toilet/recliner). General Gait Details: very little improvement from yesterday.  Pt still required + 2 assist with 75% VC's on proper sequencing, upright posture and proper walker advancement.  Too much cueing overloaded her.  Son present and observed.  Gait limited by cognitive deficits of poor initaiation and coordination.  HIGH FALL RISK  Follow Up Recommendations  Home health PT (plan is to stay a few extra days then D/C to home with 24/7 family support)     Equipment Recommendations  Rolling walker with 5" wheels (youth)    Recommendations for Other Services       Precautions / Restrictions Precautions Precautions: Fall Restrictions Weight Bearing Restrictions: No Other Position/Activity Restrictions: WBAT    Mobility  Bed Mobility Overal bed mobility: Needs Assistance     Sidelying to  sit: Mod assist       General bed mobility comments: Required increased time, assist with L LE and extra assist to complete scooting to EOB  Transfers Overall transfer level: Needs assistance Equipment used: Rolling walker (2 wheeled) Transfers: Sit to/from [facility] Sit to Stand: +2 physical assistance;+2 safety/equipment;From elevated surface;Mod assist Stand pivot transfers: Max assist;Total assist;+2 physical assistance;+2 safety/equipment       General transfer comment: pt required + 2 side by side assist and 75% VC's on proper hand placement to push off vs pull up on walker.  Right after giving instruction pt repeats "what do I do?".  Pt required repeat simple functional VC's on safety with turn completion and upright stance.  Still present with delayed initiation and severe posterior lean with no self correction other than stating "I'm falling backwards".  Therapist had to provide "buttock" suppport with turns to avoid missing target(toilet/recliner).  Ambulation/Gait Ambulation/Gait assistance: Max assist;+2 physical assistance;+2 safety/equipment Gait Distance (Feet): 8 Feet Assistive device: Rolling walker (2 wheeled) Gait Pattern/deviations: Step-to pattern;Narrow base of support;Decreased stance time - left;Leaning posteriorly Gait velocity: decreased   General Gait Details: very little improvement from yesterday.  Pt still required + 2 assist with 75% VC's on proper sequencing, upright posture and proper walker advancement.  Too much cueing overloaded her.  Son present and observed.  Gait limited by cognitive deficits of poor initaiation and coordination.  HIGH FALL RISK   Stairs             Wheelchair Mobility    Modified Rankin (Stroke Patients  Only)       Balance                                            Cognition Arousal/Alertness: Awake/alert Behavior During Therapy:  (pleasant) Overall Cognitive Status:  Impaired/Different from baseline Area of Impairment: Following commands;Problem solving;Memory;Safety/judgement                     Memory: Decreased short-term memory Following Commands: Follows one step commands inconsistently Safety/Judgement: Decreased awareness of safety   Problem Solving: Slow processing;Decreased initiation;Requires verbal cues;Requires tactile cues;Difficulty sequencing General Comments: 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  Repeats "how long does the pain last?"      Exercises      General Comments        Pertinent Vitals/Pain Pain Assessment: Faces Faces Pain Scale: Hurts even more Pain Location: Lt hip/thigh Pain Descriptors / Indicators: Discomfort;Aching;Guarding;Grimacing Pain Intervention(s): Monitored during session;Repositioned;Ice applied    Home Living                      Prior Function            PT Goals (current goals can now be found in the care plan section) Progress towards PT goals: Progressing toward goals    Frequency    7X/week      PT Plan Current plan remains appropriate    Co-evaluation              AM-PAC PT "6 Clicks" Mobility   Outcome Measure  Help needed turning from your back to your side while in a flat bed without using bedrails?: A Lot Help needed moving from lying on your back to sitting on the side of a flat bed without using bedrails?: A Lot Help needed moving to and from a bed to a chair (including a wheelchair)?: A Lot Help needed standing up from a chair using your arms (e.g., wheelchair or bedside chair)?: A Lot Help needed to walk in hospital room?: A Lot Help needed climbing 3-5 steps with a railing? : Total 6 Click Score: 11    End of Session Equipment Utilized During Treatment: Gait belt Activity Tolerance: Other (comment) (limited by cognitive deficts) Patient left: in chair;with call bell/phone  within reach;with chair alarm set;with family/visitor present Nurse Communication: Mobility status PT Visit Diagnosis: Muscle weakness (generalized) (M62.81);Difficulty in walking, not elsewhere classified (R26.2);Pain Pain - Right/Left: Left Pain - part of body: Hip     Time: 0850-0930 PT Time Calculation (min) (ACUTE ONLY): 40 min  Charges:  $Gait Training: 23-37 mins $Therapeutic Activity: 8-22 mins                     Rica Koyanagi  PTA Acute  Rehabilitation Services Pager      [phone] Office      [phone]

## 2020-11-02 ENCOUNTER — Inpatient Hospital Stay (HOSPITAL_COMMUNITY): Payer: Medicare Other

## 2020-11-02 NOTE — Plan of Care (Signed)
  Problem: Education: Goal: Knowledge of General Education information will improve Description Including pain rating scale, medication(s)/side effects and non-pharmacologic comfort measures Outcome: Progressing   

## 2020-11-02 NOTE — Progress Notes (Signed)
Patient ID: SHAWANNA ZANDERS, female   DOB: Apr 02, 1946, 74 y.o.   MRN: [id number]  I was called by the radiologist with the reading of a minimally displaced acetabular fracture.  I discussed the findings with Dr Percell Miller.  He has recommended a CT scan to further assess the position of the fracture to determine if any surgical intervention is need or just limited weight bearing.   Rashay Barnette A. Kaleen Mask Physician Assistant [facility] [phone]  11/02/2020, 2:30 PM

## 2020-11-02 NOTE — Plan of Care (Signed)
  Problem: Education: Goal: Knowledge of General Education information will improve Description Including pain rating scale, medication(s)/side effects and non-pharmacologic comfort measures Outcome: Progressing   Problem: Activity: Goal: Risk for activity intolerance will decrease Outcome: Progressing   Problem: Safety: Goal: Ability to remain free from injury will improve Outcome: Progressing   

## 2020-11-02 NOTE — Progress Notes (Signed)
PT Cancellation Note  Patient Details Name: [name] MRN: [id number] DOB: 1946/08/07   Cancelled Treatment:    Reason Eval/Treat Not Completed: Other (comment)  Spoke with son who is now agreeable to SNF.  He reports that pt just essentially did therapy with the ortho PA, she is now on [facility], and then wants to return to bed due to fatigue.  Will f/u in afternoon for therapy.  Abran Richard, PT Acute Rehab Services Pager [phone] [facility] 11/02/2020, 10:25 AM

## 2020-11-02 NOTE — Progress Notes (Addendum)
Physical Therapy Treatment Patient Details Name: [name] MRN: [id number] DOB: January 17, 1946 Today's Date: 11/02/2020    History of Present Illness Patient is 74 y.o. female s/p Lt THA anterior approach on 10/29/20 with PMH significant for HTN, OA.    PT Comments    Pt making gradual progress but continues to be limited by pain with weight bearing.  Tolerated exercise and transfers well.  She does still have cognitive deficits requiring repeated cues for transfer techniques and will often ask the same questions.    Pt was seen for therapy, after therapy x-ray findings were entered into computer and pt found to have: "Findings concerning for acute, vertically oriented fracture of the lateral aspect of the pubic portion of the left acetabulum extending to the acetabular component of total hip arthroplasty. The femoral portion of the arthroplasty is well seated in the femur. "  Noted PA note for CT to determine plan (sx vs WBing status).   Pt will need further therapy assessment next visit to determine appropriate POC considering new findings.    Follow Up Recommendations  SNF     Equipment Recommendations  Rolling walker with 5" wheels (youth)    Recommendations for Other Services       Precautions / Restrictions Precautions Precautions: Fall Restrictions Other Position/Activity Restrictions: WBAT    Mobility  Bed Mobility Overal bed mobility: Needs Assistance Bed Mobility: Supine to Sit     Supine to sit: Min guard        Transfers Overall transfer level: Needs assistance Equipment used: Rolling walker (2 wheeled) Transfers: Sit to/from Stand Sit to Stand: Min assist;+2 safety/equipment         General transfer comment: Performed sit to stand x 4 throughout session ,required cues to push up with each attempt.  Min A to steady once up due to posterior lean.  Ambulation/Gait Ambulation/Gait assistance: Min assist;+2 safety/equipment Gait Distance (Feet): 45  Feet (10'x2 then 25') Assistive device: Rolling walker (2 wheeled) Gait Pattern/deviations: Step-to pattern;Trunk flexed;Decreased stance time - left Gait velocity: decreased   General Gait Details: Chair follow for safety and rest breaks; trunk flexed and pt unable to correct; Cues for sequencing - performed step to L pattern with cues to push through arms for decreased weight on L LE during stance due to pain.   Stairs             Wheelchair Mobility    Modified Rankin (Stroke Patients Only)       Balance Overall balance assessment: Needs assistance Sitting-balance support: Feet supported;No upper extremity supported Sitting balance-Leahy Scale: Fair Sitting balance - Comments: static sitting without assist Postural control: Posterior lean Standing balance support: Bilateral upper extremity supported Standing balance-Leahy Scale: Poor Standing balance comment: Required RW. Worked on posture in standing - cues to tuck buttock and stand straight - when standing straight pt with posterior lean and unable to correct without min A (gave cues to lean forward, increase weight on toes, etc)                            Cognition Arousal/Alertness: Awake/alert Behavior During Therapy: WFL for tasks assessed/performed Overall Cognitive Status: Impaired/Different from baseline Area of Impairment: Problem solving                     Memory: Decreased short-term memory       Problem Solving: Difficulty sequencing;Requires verbal cues;Requires tactile cues General Comments:  A and O x 3 however continues to require repetition and no carry over of instructions.  For example, instructed pt to push up from chair to stand and she states "wow, that is great no one has told me that before."  Then for the next transfers , instructed again and pt stated the same thing.      Exercises Total Joint Exercises Ankle Circles/Pumps: AROM;Both;20 reps;Seated Quad Sets:  AROM;Both;Supine;20 reps Hip ABduction/ADduction: AAROM;Left;10 reps;Supine Long Arc Quad: AAROM;10 reps;Seated;20 reps;Both General Exercises - Lower Extremity Hip Flexion/Marching: AAROM;Both;20 reps;Seated    General Comments        Pertinent Vitals/Pain Pain Assessment: Faces Faces Pain Scale: Hurts even more Pain Location: Lt hip/thigh with weight bearing Pain Descriptors / Indicators: Grimacing Pain Intervention(s): Monitored during session;Premedicated before session;Repositioned;Ice applied;Limited activity within patient's tolerance;Other (comment) (encouraged use of RW to relief weight)    Home Living                      Prior Function            PT Goals (current goals can now be found in the care plan section) Acute Rehab PT Goals Patient Stated Goal: stop hurting PT Goal Formulation: With patient/family Time For Goal Achievement: 11/05/20 Potential to Achieve Goals: Good Progress towards PT goals: Progressing toward goals    Frequency    7X/week      PT Plan Discharge plan needs to be updated (family now agreeable to SNF)    Co-evaluation              AM-PAC PT "6 Clicks" Mobility   Outcome Measure  Help needed turning from your back to your side while in a flat bed without using bedrails?: A Little Help needed moving from lying on your back to sitting on the side of a flat bed without using bedrails?: A Little Help needed moving to and from a bed to a chair (including a wheelchair)?: A Lot Help needed standing up from a chair using your arms (e.g., wheelchair or bedside chair)?: A Little Help needed to walk in hospital room?: A Little Help needed climbing 3-5 steps with a railing? : Total 6 Click Score: 15    End of Session Equipment Utilized During Treatment: Gait belt Activity Tolerance: Patient tolerated treatment well Patient left: in chair;with call bell/phone within reach;with chair alarm set;with family/visitor  present Nurse Communication: Mobility status PT Visit Diagnosis: Muscle weakness (generalized) (M62.81);Difficulty in walking, not elsewhere classified (R26.2);Pain Pain - Right/Left: Left Pain - part of body: Hip     Time: 1610-9604 PT Time Calculation (min) (ACUTE ONLY): 40 min  Charges:  $Gait Training: 8-22 mins $Therapeutic Exercise: 8-22 mins $Therapeutic Activity: 8-22 mins                     Abran Richard, PT Acute Rehab Services Pager [phone] [facility] 11/02/2020, 2:55 PM

## 2020-11-02 NOTE — Progress Notes (Addendum)
Subjective: 4 Days Post-Op Procedure(s) (LRB): TOTAL HIP ARTHROPLASTY ANTERIOR APPROACH (Left) Patient reports pain as significant when she puts weight on it trying to walk.    Objective: Vital signs in last 24 hours: Temp:  [98 F (36.7 C)-98.4 F (36.9 C)] 98.2 F (36.8 C) (12/11 0442) Pulse Rate:  [61-81] 68 (12/11 0642) Resp:  [16-17] 16 (12/11 0442) BP: (152-170)/(75-81) 160/75 (12/11 0642) SpO2:  [95 %-99 %] 99 % (12/11 0442)  Intake/Output from previous day: 12/10 0701 - 12/11 0700 In: 18 [P.O.:490] Out: 300 [Urine:300] Intake/Output this shift: Total I/O In: 150 [P.O.:150] Out: 100 [Urine:100]  No results for input(s): HGB in the last 72 hours. No results for input(s): WBC, RBC, HCT, PLT in the last 72 hours. No results for input(s): NA, K, CL, CO2, BUN, CREATININE, GLUCOSE, CALCIUM in the last 72 hours. No results for input(s): LABPT, INR in the last 72 hours.  patient is unable to stand up straight despite the assistance of a walker    Assessment/Plan: 4 Days Post-Op Procedure(s) (LRB): TOTAL HIP ARTHROPLASTY ANTERIOR APPROACH (Left) Up with therapy   Anticipated LOS equal to or greater than 2 midnights due to - Age 50 and older with one or more of the following:  - Obesity  - Expected need for hospital services (PT, OT, Nursing) required for safe  discharge  - Anticipated need for postoperative skilled nursing care or inpatient rehab  - Active co-morbidities: none OR   - Unanticipated findings during/Post Surgery: Slow post-op progression: GI, pain control, mobility  - Patient is a high risk of re-admission due to: None  Patient is significantly struggling with ambulation.  She is able to get to sitting on the side of the bed with supervision but she is 1+ assist to stand or walk.  She is unable to stand up straight and has significant difficulty with walking due to inability to stand up straight and pain with weight bearing.  I did discuss skilled  nursing for her with her son.  He is requesting placement in [place] since this is where he lives and he would like her to be close to him.    I will work with her again tomorrow.    I have ordered a STAT portable pelvis xray to confirm that the hip replacement is in proper position Mariann Palo J Mahdi Frye 11/02/2020, 11:40 AM

## 2020-11-03 ENCOUNTER — Other Ambulatory Visit: Payer: Self-pay | Admitting: Physician Assistant

## 2020-11-03 LAB — COMPREHENSIVE METABOLIC PANEL
ALT: 11 U/L (ref 0–44)
AST: 20 U/L (ref 15–41)
Albumin: 2.9 g/dL — ABNORMAL LOW (ref 3.5–5.0)
Alkaline Phosphatase: 51 U/L (ref 38–126)
Anion gap: 14 (ref 5–15)
BUN: 15 mg/dL (ref 8–23)
CO2: 23 mmol/L (ref 22–32)
Calcium: 8.7 mg/dL — ABNORMAL LOW (ref 8.9–10.3)
Chloride: 99 mmol/L (ref 98–111)
Creatinine, Ser: 1.08 mg/dL — ABNORMAL HIGH (ref 0.44–1.00)
GFR, Estimated: 53 mL/min — ABNORMAL LOW (ref >60)
Glucose, Bld: 130 mg/dL — ABNORMAL HIGH (ref 70–99)
Potassium: 4 mmol/L (ref 3.5–5.1)
Sodium: 136 mmol/L (ref 135–145)
Total Bilirubin: 0.6 mg/dL (ref 0.3–1.2)
Total Protein: 6.1 g/dL — ABNORMAL LOW (ref 6.5–8.1)

## 2020-11-03 LAB — CBC
HCT: 35.2 % — ABNORMAL LOW (ref 36.0–46.0)
Hemoglobin: 11 g/dL — ABNORMAL LOW (ref 12.0–15.0)
MCH: 28.6 pg (ref 26.0–34.0)
MCHC: 31.3 g/dL (ref 30.0–36.0)
MCV: 91.4 fL (ref 80.0–100.0)
Platelets: 303 10*3/uL (ref 150–400)
RBC: 3.85 MIL/uL — ABNORMAL LOW (ref 3.87–5.11)
RDW: 13.8 % (ref 11.5–15.5)
WBC: 8 10*3/uL (ref 4.0–10.5)
nRBC: 0 % (ref 0.0–0.2)

## 2020-11-03 MED ORDER — POLYETHYLENE GLYCOL 3350 17 G PO PACK
17.0000 g | PACK | Freq: Two times a day (BID) | ORAL | Status: DC
  Administered 2020-11-03 – 2020-11-05 (×6): 17 g via ORAL
  Filled 2020-11-03 (×5): qty 1

## 2020-11-03 MED ORDER — DICLOFENAC SODIUM 1 % EX GEL
2.0000 g | Freq: Four times a day (QID) | CUTANEOUS | Status: DC
  Administered 2020-11-03 – 2020-11-04 (×5): 2 g via TOPICAL
  Filled 2020-11-03: qty 100

## 2020-11-03 NOTE — Plan of Care (Signed)
  Problem: Education: Goal: Knowledge of General Education information will improve Description: Including pain rating scale, medication(s)/side effects and non-pharmacologic comfort measures Outcome: Progressing   Problem: Activity: Goal: Risk for activity intolerance will decrease Outcome: Progressing   Problem: Nutrition: Goal: Adequate nutrition will be maintained Outcome: Progressing   Problem: Coping: Goal: Level of anxiety will decrease Outcome: Progressing   

## 2020-11-03 NOTE — Progress Notes (Addendum)
Physical Therapy Treatment Patient Details Name: [name] MRN: [id number] DOB: [birth year] Today's Date: 11/03/2020    History of Present Illness Patient is 74 y.o. female s/p Lt THA anterior approach on 10/29/20 with PMH significant for HTN, OA.  Pt for CT 11/01/20 and non-displaced acetabular fx found - pt now 50% PWB    PT Comments    Pt up to ambulate increased distance in hall as well as to/from bathroom. Noted improvement in posture and ability to bring wt up and fwd to standing and with no posterior drift on last two attempts.  Pt continues to require step-by-step sequencing cues for gait but this session, pt intermittently self-cueing verbally.   Follow Up Recommendations  SNF     Equipment Recommendations  Rolling walker with 5" wheels    Recommendations for Other Services       Precautions / Restrictions Precautions Precautions: Fall Restrictions Weight Bearing Restrictions: Yes LLE Weight Bearing: Partial weight bearing LLE Partial Weight Bearing Percentage or Pounds: 50% PWB per PA - verbally and in note dated 11/03/20 Other Position/Activity Restrictions: WBAT    Mobility  Bed Mobility               General bed mobility comments: Pt up in chair and requests back to same  Transfers Overall transfer level: Needs assistance Equipment used: Rolling walker (2 wheeled) Transfers: Sit to/from Stand Sit to Stand: Min guard         General transfer comment: cues for LE management and use of UEs to self assist.  Pt up/down from recliner and BSC x 4.  Ambulation/Gait Ambulation/Gait assistance: Min assist Gait Distance (Feet): 24 Feet (and 8'x2 to/from bathroom) Assistive device: Rolling walker (2 wheeled) Gait Pattern/deviations: Step-to pattern;Trunk flexed;Decreased stance time - left Gait velocity: decreased   General Gait Details: Chair follow for safety.  Step-by-step cues for posture, sequence, UE WB, foot placement.  Physical assist for  balance/support and RW management   Stairs             Wheelchair Mobility    Modified Rankin (Stroke Patients Only)       Balance Overall balance assessment: Needs assistance Sitting-balance support: Feet supported;No upper extremity supported Sitting balance-Leahy Scale: Fair Sitting balance - Comments: static sitting without assist Postural control: Posterior lean Standing balance support: Bilateral upper extremity supported Standing balance-Leahy Scale: Poor Standing balance comment: posterior drift improved with increased distance ambulated                            Cognition Arousal/Alertness: Awake/alert Behavior During Therapy: WFL for tasks assessed/performed Overall Cognitive Status: Impaired/Different from baseline Area of Impairment: Problem solving                     Memory: Decreased short-term memory Following Commands: Follows one step commands inconsistently Safety/Judgement: Decreased awareness of safety   Problem Solving: Difficulty sequencing;Requires verbal cues;Requires tactile cues General Comments: A and O x 3 however continues to require repetition and limited carry over of instructions.      Exercises      General Comments        Pertinent Vitals/Pain Pain Assessment: Faces Faces Pain Scale: Hurts little more Pain Location: Lt hip/thigh with weight bearing Pain Descriptors / Indicators: Grimacing;Guarding;Sore Pain Intervention(s): Limited activity within patient's tolerance;Monitored during session    Home Living  Prior Function            PT Goals (current goals can now be found in the care plan section) Acute Rehab PT Goals Patient Stated Goal: stop hurting and regain IND PT Goal Formulation: With patient/family Time For Goal Achievement: 11/05/20 Potential to Achieve Goals: Good Progress towards PT goals: Progressing toward goals    Frequency    7X/week       PT Plan Current plan remains appropriate    Co-evaluation              AM-PAC PT "6 Clicks" Mobility   Outcome Measure  Help needed turning from your back to your side while in a flat bed without using bedrails?: A Little Help needed moving from lying on your back to sitting on the side of a flat bed without using bedrails?: A Little Help needed moving to and from a bed to a chair (including a wheelchair)?: A Lot Help needed standing up from a chair using your arms (e.g., wheelchair or bedside chair)?: A Little Help needed to walk in hospital room?: A Little Help needed climbing 3-5 steps with a railing? : Total 6 Click Score: 15    End of Session Equipment Utilized During Treatment: Gait belt Activity Tolerance: Patient tolerated treatment well Patient left: in chair;with call bell/phone within reach;with chair alarm set;with family/visitor present Nurse Communication: Mobility status PT Visit Diagnosis: Muscle weakness (generalized) (M62.81);Difficulty in walking, not elsewhere classified (R26.2);Pain Pain - Right/Left: Left Pain - part of body: Hip     Time: 3202-3343 PT Time Calculation (min) (ACUTE ONLY): 32 min  Charges:  $Gait Training: 8-22 mins $Therapeutic Activity: 8-22 mins                     [place] Pager [phone] Office [phone]    Lynette Noah 11/03/2020, 6:01 PM

## 2020-11-03 NOTE — Progress Notes (Signed)
Subjective: 5 Days Post-Op Procedure(s) (LRB): TOTAL HIP ARTHROPLASTY ANTERIOR APPROACH (Left) Patient reports pain as excruciating with weightbearing   Objective: Vital signs in last 24 hours: Temp:  [98.2 F (36.8 C)-99 F (37.2 C)] 98.2 F (36.8 C) (12/12 0525) Pulse Rate:  [68-79] 68 (12/12 0525) Resp:  [16-20] 16 (12/12 0525) BP: (131-177)/(70-86) 165/70 (12/12 0634) SpO2:  [95 %-99 %] 99 % (12/12 0525)  Intake/Output from previous day: 12/11 0701 - 12/12 0700 In: 680 [P.O.:680] Out: 300 [Urine:300] Intake/Output this shift: No intake/output data recorded.  No results for input(s): HGB in the last 72 hours. No results for input(s): WBC, RBC, HCT, PLT in the last 72 hours. No results for input(s): NA, K, CL, CO2, BUN, CREATININE, GLUCOSE, CALCIUM in the last 72 hours. No results for input(s): LABPT, INR in the last 72 hours.  Patient has significant pain with weight bearing on her left leg   She is struggling with mobilization due to poor balance, chronic back pain and new onset hip pain.     Assessment/Plan: 5 Days Post-Op Procedure(s) (LRB): TOTAL HIP ARTHROPLASTY ANTERIOR APPROACH (Left) Advance diet Up with therapy Discharge to SNF   Anticipated LOS equal to or greater than 2 midnights due to - Age 76 and older with one or more of the following:  - Obesity  - Expected need for hospital services (PT, OT, Nursing) required for safe  discharge  - Anticipated need for postoperative skilled nursing care or inpatient rehab  - Active co-morbidities: None OR   - Unanticipated findings during/Post Surgery: perioperative acetabular fracture  - Patient is a high risk of re-admission due to: None   After a discussion with the patient and the family, we are adding diclofenac gel for pain control.  Patient uses Salonpas arthritis strength gel mix with the Salonpas lidocaine gel at home to manage her pain because she has such difficulty swallowing pills.  Worked very hard  today on standing and mobilizing with 50% weightbearing    She is SLOWLY improving.  SNF will be medically necessary as she is not safe to stand without assistance yet much less ambulate without assistance.  Patient and her family are requesting SNF in [place] due to the proximity to her son [name].      Draden Cottingham J Winton Offord 11/03/2020, 10:34 AM

## 2020-11-03 NOTE — Progress Notes (Addendum)
Physical Therapy Treatment Patient Details Name: [name] MRN: [id number] DOB: 11-04-1946 Today's Date: 11/03/2020    History of Present Illness Patient is 74 y.o. female s/p Lt THA anterior approach on 10/29/20 with PMH significant for HTN, OA.  Pt for CT 11/01/20 and non-displaced acetabular fx found - pt now 50% PWB    PT Comments    Pt cooperative but expressing frustration at newly noted acetabular fx.  Pt up to ambulate limited distance into hall with assist for balance/support and RW management as well as step-by-step cues for sequence.   Follow Up Recommendations  SNF     Equipment Recommendations  Rolling walker with 5" wheels    Recommendations for Other Services       Precautions / Restrictions Precautions Precautions: Fall Restrictions Weight Bearing Restrictions: Yes LLE Weight Bearing: Partial weight bearing LLE Partial Weight Bearing Percentage or Pounds: 50% per PA - verbal and in note from 11/03/20    Mobility  Bed Mobility               General bed mobility comments: Pt up in chair and requests back to same  Transfers Overall transfer level: Needs assistance Equipment used: Rolling walker (2 wheeled) Transfers: Sit to/from Stand Sit to Stand: Min assist;+2 safety/equipment         General transfer comment: cues for LE management, use of UEs to self assist,.  Physical assist to bring wt up and fwd and to correct for posterior drift in standing  Ambulation/Gait Ambulation/Gait assistance: Min assist;+2 physical assistance;+2 safety/equipment Gait Distance (Feet): 18 Feet Assistive device: Rolling walker (2 wheeled) Gait Pattern/deviations: Step-to pattern;Trunk flexed;Decreased stance time - left Gait velocity: decreased   General Gait Details: Chair follow for safety.  Step-by-step cues for posture, sequence, UE WB, foot placement.  Physical assist for balance/support and RW management   Stairs             Wheelchair  Mobility    Modified Rankin (Stroke Patients Only)       Balance Overall balance assessment: Needs assistance Sitting-balance support: Feet supported;No upper extremity supported Sitting balance-Leahy Scale: Fair Sitting balance - Comments: static sitting without assist Postural control: Posterior lean Standing balance support: Bilateral upper extremity supported Standing balance-Leahy Scale: Poor Standing balance comment: posterior drift improved with increased distance ambulated                            Cognition Arousal/Alertness: Awake/alert Behavior During Therapy: WFL for tasks assessed/performed Overall Cognitive Status: Impaired/Different from baseline Area of Impairment: Problem solving                     Memory: Decreased short-term memory Following Commands: Follows one step commands inconsistently     Problem Solving: Difficulty sequencing;Requires verbal cues;Requires tactile cues General Comments: A and O x 3 however continues to require repetition and limited carry over of instructions.      Exercises      General Comments        Pertinent Vitals/Pain Pain Assessment: Faces Faces Pain Scale: Hurts little more Pain Location: Lt hip/thigh with weight bearing Pain Descriptors / Indicators: Grimacing;Guarding;Sore Pain Intervention(s): Limited activity within patient's tolerance;Monitored during session    Home Living                      Prior Function            PT  Goals (current goals can now be found in the care plan section) Acute Rehab PT Goals Patient Stated Goal: stop hurting and regain IND PT Goal Formulation: With patient/family Time For Goal Achievement: 11/05/20 Potential to Achieve Goals: Good Progress towards PT goals: Progressing toward goals    Frequency    7X/week      PT Plan Current plan remains appropriate    Co-evaluation              AM-PAC PT "6 Clicks" Mobility   Outcome  Measure  Help needed turning from your back to your side while in a flat bed without using bedrails?: A Little Help needed moving from lying on your back to sitting on the side of a flat bed without using bedrails?: A Little Help needed moving to and from a bed to a chair (including a wheelchair)?: A Lot Help needed standing up from a chair using your arms (e.g., wheelchair or bedside chair)?: A Little Help needed to walk in hospital room?: A Little Help needed climbing 3-5 steps with a railing? : Total 6 Click Score: 15    End of Session Equipment Utilized During Treatment: Gait belt Activity Tolerance: Patient limited by fatigue;Patient limited by pain Patient left: in chair;with call bell/phone within reach;with chair alarm set;with family/visitor present Nurse Communication: Mobility status PT Visit Diagnosis: Muscle weakness (generalized) (M62.81);Difficulty in walking, not elsewhere classified (R26.2);Pain Pain - Right/Left: Left Pain - part of body: Hip     Time: 9518-8416 PT Time Calculation (min) (ACUTE ONLY): 19 min  Charges:  $Gait Training: 8-22 mins                     [place] Pager [phone] Office [phone]    Binnie Droessler 11/03/2020, 5:53 PM

## 2020-11-03 NOTE — Progress Notes (Signed)
I have reviewed her imaging and discussed with patient and family  Acetabular low transvers fx. Implant is stable. Plan: 50% WB May need SNF placement but will keep working with PT until placement could be available in [place].    [name]

## 2020-11-04 ENCOUNTER — Inpatient Hospital Stay (HOSPITAL_COMMUNITY): Payer: Medicare Other

## 2020-11-04 LAB — SARS CORONAVIRUS 2 BY RT PCR (HOSPITAL ORDER, PERFORMED IN ~~LOC~~ HOSPITAL LAB): SARS Coronavirus 2: NEGATIVE

## 2020-11-04 MED ORDER — TUBERCULIN PPD 5 UNIT/0.1ML ID SOLN
5.0000 [IU] | Freq: Once | INTRADERMAL | Status: DC
  Administered 2020-11-04: 18:00:00 5 [IU] via INTRADERMAL
  Filled 2020-11-04: qty 0.1

## 2020-11-04 MED ORDER — DICLOFENAC SODIUM 1 % EX GEL: 2.0000 g | Freq: Four times a day (QID) | CUTANEOUS | 2 refills | Status: AC

## 2020-11-04 NOTE — TOC Progression Note (Signed)
Transition of Care [facility]) - Progression Note    Patient Details  Name: [name] MRN: [id number] Date of Birth: March 06, 1946  Transition of Care [facility]) CM/SW Contact  Lennart Pall, LCSW Phone Number: 11/04/2020, 4:28 PM  Clinical Narrative:    Alerted this morning that plan has changed to SNF as new fx found on recent xray.  Have spoken with [name], RN who confirms with son, [name], that they prefer SNF in [place] area.  Working with [name], able to secure a SNF bed at [facility] in [place], [place] - contact in admissions = [place] @ [phone].   Family also aware that transport will be out-of-pocket cost for pt/ family and agreeable.  Transportation arranged with [facility] (contact, [place] @ [phone]).  They cannot provide transport, however, until Wednesday - has been scheduled for 8:00 am pick up on that day.  Will continue to follow and ensure pt ready for dc 11/06/20 at 8:00 am.     Barriers to Discharge: Barriers Resolved  Expected Discharge Plan and Services                           DME Arranged: Walker rolling DME Agency: [employer]       HH Arranged: PT [place] Agency: [facility] [employer]         Social Determinants of Health ([place]) Interventions    Readmission Risk Interventions No flowsheet data found.

## 2020-11-04 NOTE — Discharge Summary (Addendum)
Discharge Summary  Patient ID: BINTOU LAFATA MRN: [id number] DOB/AGE: June 01, 1946 74 y.o.  Admit date: 10/29/2020 Discharge date:   Admission Diagnoses:  S/P total hip arthroplasty  Discharge Diagnoses:  Principal Problem:   S/P total hip arthroplasty Unanticipated findings during/Post Surgery: perioperative acetabular fracture   Past Medical History:  Diagnosis Date   Arthritis    Hypertension    not currently on b/p meds x one month at preop of 10/24/20     Surgeries: Procedure(s): TOTAL HIP ARTHROPLASTY ANTERIOR APPROACH on 10/29/2020   Consultants (if any):   Discharged Condition: Improved  Hospital Course: KENDALYN CRANFIELD is an 74 y.o. female who was admitted 10/29/2020 with a diagnosis of S/P total hip arthroplasty and went to the operating room on 10/29/2020 and underwent the above named procedures.  She had a lot of pain post-operatively due to her inability to tolerate narcotic pain medications. PT/OT worked with her and she made very slow progress due to a great amount of pain with weightbearing. Eventually an XR of the pelvis was ordered to assess the THA which showed no obvious abnormalities. Subsequently a CT of the left hip was ordered which did show a non-displaced acetabular fracture. Pt. Was then made 50% weightbearing to the LLE. It was determined that she would best benefit from inpatient rehab. After a discussion with her and her family it was determined that she would best benefit from placement in [place], [place] where her son [name] lives.    She was given perioperative antibiotics:  Anti-infectives (From admission, onward)   Start     Dose/Rate Route Frequency Ordered Stop   10/29/20 2200  trimethoprim (TRIMPEX) tablet 100 mg        100 mg Oral Daily at bedtime 10/29/20 1327     10/29/20 1430  ceFAZolin (ANCEF) IVPB 2g/100 mL premix        2 g 200 mL/hr over 30 Minutes Intravenous Every 6 hours 10/29/20 1332 10/29/20 2037   10/29/20 0600  ceFAZolin (ANCEF) IVPB  2g/100 mL premix        2 g 200 mL/hr over 30 Minutes Intravenous On call to O.R. 10/29/20 7412 10/29/20 8786    .  She was given sequential compression devices, early ambulation, and Xarelto 10mg  daily for DVT prophylaxis.  She benefited maximally from the hospital stay. Her stay was complicated by an unanticipated finding of a perioperative acetabular fracture.   Recent vital signs:  Vitals:   11/03/20 2054 11/04/20 0533  BP: 136/85 (!) 171/74  Pulse: 67 64  Resp: 15 16  Temp: 98.5 F (36.9 C) 98 F (36.7 C)  SpO2: 97% 99%    Recent laboratory studies:  Lab Results  Component Value Date   HGB 11.0 (L) 11/03/2020   HGB 13.1 10/24/2020   HGB 13.3 09/29/2011   Lab Results  Component Value Date   WBC 8.0 11/03/2020   PLT 303 11/03/2020   No results found for: INR Lab Results  Component Value Date   NA 136 11/03/2020   K 4.0 11/03/2020   CL 99 11/03/2020   CO2 23 11/03/2020   BUN 15 11/03/2020   CREATININE 1.08 (H) 11/03/2020   GLUCOSE 130 (H) 11/03/2020    Discharge Medications:   Allergies as of 11/04/2020      Reactions   Statins Anaphylaxis, Swelling   All Statins Extreme leg cramps Body ache  Increase memory loss Gas and Belching   Baclofen Other (See Comments)   Dizzy/o strong  Codeine Nausea Only, Other (See Comments)   Blurred Vision Does not like the way it makes her feel   Mobic [meloxicam] Other (See Comments)   To strong and knocks her out   Nsaids    Perforated ulcers   Other Other (See Comments)   12 Hours nasal spray/adverse effects   Percocet [oxycodone-acetaminophen] Nausea Only   Does not like the way it makes her feel   Vicodin [hydrocodone-acetaminophen] Nausea Only   Does not like the way it makes her feel   Zetia [ezetimibe] Hives   Fosamax [alendronate] Palpitations   Zestril [lisinopril] Rash      Medication List    TAKE these medications   acetaminophen 500 MG tablet Commonly known as: TYLENOL Take 2 tablets (1,000  mg total) by mouth every 6 (six) hours as needed.   Calcium 600-D 600-400 MG-UNIT tablet Generic drug: Calcium Carbonate-Vitamin D Take 2 tablets by mouth daily.   denosumab 60 MG/ML Sosy injection Commonly known as: PROLIA Inject 60 mg into the skin every 6 (six) months.   diclofenac Sodium 1 % Gel Commonly known as: VOLTAREN Apply 2 g topically 4 (four) times daily.   metoprolol succinate 50 MG 24 hr tablet Commonly known as: TOPROL-XL Take 50 mg by mouth daily.   metoprolol tartrate 50 MG tablet Commonly known as: LOPRESSOR Take 50 mg by mouth 2 (two) times daily. Take 1/2 tablet two times daily Started on 10/28/2020 .   multivitamin capsule Take 1 capsule by mouth every morning.   omeprazole 20 MG capsule Commonly known as: PriLOSEC Take 1 capsule (20 mg total) by mouth daily for 14 days.   ondansetron 4 MG tablet Commonly known as: Zofran Take 1 tablet (4 mg total) by mouth daily as needed for up to 14 days for nausea or vomiting.   rivaroxaban 10 MG Tabs tablet Commonly known as: XARELTO Take 1 tablet (10 mg total) by mouth daily.   SALONPAS ARTHRITIS PAIN RELIEF EX Apply 1 application topically daily as needed (pain).   trimethoprim 100 MG tablet Commonly known as: TRIMPEX Take 100 mg by mouth at bedtime.     ASK your doctor about these medications   traMADol 50 MG tablet Commonly known as: Ultram Take 1 tablet (50 mg total) by mouth every 6 (six) hours as needed for up to 5 days. Ask about: Should I take this medication?       Diagnostic Studies: CT PELVIS WO CONTRAST  Result Date: 11/02/2020 CLINICAL DATA:  Left acetabular fracture. EXAM: CT PELVIS WITHOUT CONTRAST TECHNIQUE: Multidetector CT imaging of the pelvis was performed following the standard protocol without intravenous contrast. COMPARISON:  Radiographs of same day.  CT scan of June 04, 2006. FINDINGS: Urinary Tract:  No abnormality visualized. Bowel:  Unremarkable visualized pelvic bowel  loops. Vascular/Lymphatic: Atherosclerosis of abdominal aorta is noted. No adenopathy is noted. Reproductive: Status post hysterectomy. No significant adnexal abnormality is noted. Other:  No hernia or abnormal fluid collection is noted. Musculoskeletal: Status post left total hip arthroplasty. Nondisplaced fracture is seen involving the superior aspect of the left acetabulum which extends to the margin of the acetabular component. IMPRESSION: 1. Status post left total hip arthroplasty. Nondisplaced fracture is seen involving the superior aspect of the left acetabulum which extends to the margin of the acetabular component. Aortic Atherosclerosis (ICD10-I70.0). Electronically Signed   By: Marijo Conception M.D.   On: 11/02/2020 15:52   DG Pelvis Portable  Result Date: 11/02/2020 CLINICAL DATA:  Four  days status post left hip arthroplasty, left hip pain EXAM: PORTABLE PELVIS 1-2 VIEWS COMPARISON:  10/29/2020 FINDINGS: Visualized pelvis and right hip appear intact. Recent left hip arthroplasty, partially imaged. Components appear aligned in the frontal plane. Degenerative changes of the lower lumbar spine and SI joints. Nonobstructive bowel gas pattern. IMPRESSION: Status post left hip arthroplasty.  Expected postoperative findings. No other acute osseous finding by plain radiography. Electronically Signed   By: Jerilynn Mages.  Shick M.D.   On: 11/02/2020 14:06   DG C-Arm 1-60 Min-No Report  Result Date: 10/29/2020 CLINICAL DATA:  Left hip replacement. EXAM: OPERATIVE left HIP (WITH PELVIS IF PERFORMED) 2 VIEWS TECHNIQUE: Fluoroscopic spot image(s) were submitted for interpretation post-operatively. Radiation exposure index: 0.590 mGy. COMPARISON:  None. FINDINGS: Two intraoperative fluoroscopic images were obtained of the left hip. The left femoral and acetabular components are well situated. IMPRESSION: Status post left total hip arthroplasty. Electronically Signed   By: Marijo Conception M.D.   On: 10/29/2020 08:58   DG  HIP PORT UNILAT WITH PELVIS 1V LEFT  Result Date: 11/02/2020 CLINICAL DATA:  74 year old females 4 days status post left hip total arthroplasty with increased pain with weight-bearing. EXAM: DG HIP (WITH OR WITHOUT PELVIS) 1V PORT LEFT COMPARISON:  10/29/2020 FINDINGS: Postsurgical changes after left total hip arthroplasty. No evidence of hardware fracture, loosening, or malalignment. Focal cortical interruption of the lateral aspect of the left pectineal line extending inferiorly to the acetabular component of the hip arthroplasty. The remaining visualized left pelvic ring is intact. The native femur is intact. IMPRESSION: Findings concerning for acute, vertically oriented fracture of the lateral aspect of the pubic portion of the left acetabulum extending to the acetabular component of total hip arthroplasty. The femoral portion of the arthroplasty is well seated in the femur. These results were called by telephone at the time of interpretation on 11/02/2020 at 2:08 pm to provider Matthew Saras , who verbally acknowledged these results. Electronically Signed   By: Ruthann Cancer MD   On: 11/02/2020 14:08   DG HIP OPERATIVE UNILAT W OR W/O PELVIS LEFT  Result Date: 10/29/2020 CLINICAL DATA:  Left hip replacement. EXAM: OPERATIVE left HIP (WITH PELVIS IF PERFORMED) 2 VIEWS TECHNIQUE: Fluoroscopic spot image(s) were submitted for interpretation post-operatively. Radiation exposure index: 0.590 mGy. COMPARISON:  None. FINDINGS: Two intraoperative fluoroscopic images were obtained of the left hip. The left femoral and acetabular components are well situated. IMPRESSION: Status post left total hip arthroplasty. Electronically Signed   By: Marijo Conception M.D.   On: 10/29/2020 08:58   DG Chest XR FINDINGS: Mediastinum hilar structures normal. Low lung volumes with bibasilar atelectasis/infiltrates. Small left pleural effusion. No pneumothorax.  IMPRESSION: Low lung volumes with bibasilar  atelectasis/infiltrates. Small left pleural effusion.    Disposition:   Dressing can be removed 11/12/20 and incision left to air so long as environment (I.e. her bed) remains reliably clean. If not please remove this dressing and replace PRN.  She may shower. Please cover dressing to keep it dry while showering. After dressing is removed she may get the incision wet. 50% WB on LLE. Avoid narcotics as she does not tolerate them well (severe nausea and confusion)  Please treat for constipation as needed Plan for discharge to inpatient rehab, most likely in [place], [place] F/u with Dr. Percell Miller 2-3 weeks.         Signed: Rachael Fee PA-C 11/04/2020, 8:16 AM

## 2020-11-04 NOTE — Progress Notes (Signed)
° ° °  Subjective: 74YOF POD # 6 s/p Left THA. Pt. Reports continued excruciating pain with weightbearing. Frustrated with her progress in general.  She is tolerating her diet well. Denies CP, SOB, Paraesthesia.   Objective:   VITALS:   Vitals:   11/03/20 0634 11/03/20 1329 11/03/20 2054 11/04/20 0533  BP: (!) 165/70 139/73 136/85 (!) 171/74  Pulse:  (!) 58 67 64  Resp:  17 15 16   Temp:  98.5 F (36.9 C) 98.5 F (36.9 C) 98 F (36.7 C)  TempSrc:  Oral    SpO2:  99% 97% 99%  Weight:      Height:       CBC Latest Ref Rng & Units 11/03/2020 10/24/2020 09/29/2011  WBC 4.0 - 10.5 K/uL 8.0 6.3 -  Hemoglobin 12.0 - 15.0 g/dL 11.0(L) 13.1 13.3  Hematocrit 36.0 - 46.0 % 35.2(L) 42.1 -  Platelets 150 - 400 K/uL 303 277 -   BMP Latest Ref Rng & Units 11/03/2020 10/24/2020 08/06/2011  Glucose 70 - 99 mg/dL 130(H) 106(H) 100(H)  BUN 8 - 23 mg/dL 15 18 24(H)  Creatinine 0.44 - 1.00 mg/dL 1.08(H) 1.20(H) 1.12(H)  Sodium 135 - 145 mmol/L 136 142 135  Potassium 3.5 - 5.1 mmol/L 4.0 3.7 3.8  Chloride 98 - 111 mmol/L 99 104 101  CO2 22 - 32 mmol/L 23 27 26   Calcium 8.9 - 10.3 mg/dL 8.7(L) 9.3 9.4   Intake/Output      12/12 0701 12/13 0700 12/13 0701 12/14 0700   P.O. 580    Total Intake(mL/kg) 580 (12)    Urine (mL/kg/hr) 600 (0.5)    Stool     Total Output 600    Net -20         Urine Occurrence 3 x       Physical Exam: General: NAD.  Resting in bed comfortable.  Resp: No increased wob Cardio: regular rate and rhythm ABD soft Neurologically intact MSK Neurovascularly intact Sensation intact distally Intact pulses distally Dorsiflexion/Plantar flexion intact Incision: dressing C/D/I Neurovascular intact Compartment soft  Assessment: 6 Days Post-Op  S/P Procedure(s) (LRB): TOTAL HIP ARTHROPLASTY ANTERIOR APPROACH (Left) by Dr. Ernesta Amble. Murphy on 10/29/20  Principal Problem:   S/P total hip arthroplasty  ADDITIONAL DIAGNOSIS:   Non-displaced fracture of the left  acetabulum.   Primary osteoarthritis, status post total hip arthroplasty   Plan: Discharge to SNF Incentive Spirometry Apply ice   Weight Bearing: Partial Weight Bearing @ 50% (PWB) LLE Dressings: Maintain Mepilex.   VTE prophylaxis: Xarelto 10mg  daily, SCDs, ambulation Dispo: Skilled Nursing Facility/Rehab   SNF will be medically necessary as she is not safe to stand without assistance yet much less ambulate without assistance.  Patient and her family are requesting SNF in [place] due to the proximity to her son [name].  Rachael Fee, PA-C 11/04/2020, 7:14 AM

## 2020-11-04 NOTE — Plan of Care (Signed)

## 2020-11-04 NOTE — Evaluation (Signed)
Occupational Therapy Evaluation Patient Details Name: [name] MRN: [id number] DOB: Jan 22, 1946 Today's Date: 11/04/2020    History of Present Illness Patient is 74 y.o. female s/p Lt THA anterior approach on 10/29/20 with PMH significant for HTN, OA.  Pt for CT 11/01/20 and non-displaced acetabular fx found - pt now 50% PWB   Clinical Impression   Patient is currently requiring assistance with ADLs including moderate to maximum assistance with LE dressing and bathing, toileting and mobilization to recliner or bedside commode, all of which is below patient's typical baseline of being Independent.  During this evaluation, patient was limited by LT hip to knee pain, generalized weakness, impaired memory at times, and grieving over unexpected surgical complications, which has the potential to impact patient's safety and independence during functional mobility, as well as performance for ADLs. [place] "6-clicks" Daily Activity Inpatient Short Form score of 16/24 indicates 53.32% ADL impairment this session. Patient lived alone prior to surgery, and plans to go to rehab and then live with her son in [place].  Patient demonstrates good rehab potential, and should benefit from continued skilled occupational therapy services while in acute care to maximize safety, independence and quality of life at home.  Continued occupational therapy services in a SNF setting prior to return home is recommended.  ?   Follow Up Recommendations  SNF    Equipment Recommendations   (RW)    Recommendations for Other Services       Precautions / Restrictions Precautions Precautions: Fall Restrictions Weight Bearing Restrictions: Yes LLE Weight Bearing: Partial weight bearing LLE Partial Weight Bearing Percentage or Pounds: 50% Other Position/Activity Restrictions: 50% PWB LLE      Mobility Bed Mobility Overal bed mobility: Needs Assistance Bed Mobility: Supine to Sit     Supine to sit: Min  guard;HOB elevated     General bed mobility comments: Verbal cues for sequencing    Transfers Overall transfer level: Needs assistance Equipment used: Rolling walker (2 wheeled) Transfers: Sit to/from Stand Sit to Stand: Min guard Stand pivot transfers: Mod assist       General transfer comment: cues for LE management and use of UEs to self assist.  Pt up/down from EOB x 4 reps. Required cues for hand placement and sequencing with each rep.    Balance Overall balance assessment: Needs assistance Sitting-balance support: Feet supported;No upper extremity supported Sitting balance-Leahy Scale: Fair     Standing balance support: Bilateral upper extremity supported Standing balance-Leahy Scale: Poor Standing balance comment: Pt repeating "I'm not steady" with all standing.                           ADL either performed or assessed with clinical judgement   ADL Overall ADL's : Needs assistance/impaired Eating/Feeding: Independent   Grooming: Wash/dry hands;Sitting;Set up   Upper Body Bathing: Set up;Sitting   Lower Body Bathing: Moderate assistance;Sit to/from stand;Sitting/lateral leans   Upper Body Dressing : Sitting;Set up   Lower Body Dressing: Maximal assistance;Sitting/lateral leans;Sit to/from stand   Toilet Transfer: RW;Moderate assistance;Cueing for sequencing;Cueing for IT trainer Details (indicate cue type and reason): Stand pivot to recliner using RW and only able to pivot on RT foot-unable to take step despite multi attempts and cues. Increased time/effort. Toileting- Clothing Manipulation and Hygiene: Moderate assistance;Sit to/from stand;Sitting/lateral lean Toileting - Clothing Manipulation Details (indicate cue type and reason): Based on general assessment.     Functional mobility during ADLs: Minimal assistance;Moderate  assistance;Rolling walker;Cueing for sequencing;Cueing for safety General ADL Comments: PWB of 50%  LLE     Vision   Vision Assessment?: No apparent visual deficits     Perception     Praxis      Pertinent Vitals/Pain Pain Assessment: Faces Faces Pain Scale: Hurts even more Pain Location: Lt hip/thigh with weight bearing Pain Descriptors / Indicators: Grimacing;Guarding;Sore Pain Intervention(s): Limited activity within patient's tolerance;Monitored during session;Other (comment);Repositioned;Utilized relaxation techniques;Ice applied (Applied pt's pain cream, ed pt on gentle massage to IT band area.)     Hand Dominance Right   Extremity/Trunk Assessment Upper Extremity Assessment Upper Extremity Assessment: Generalized weakness   Lower Extremity Assessment Lower Extremity Assessment: Generalized weakness;Defer to PT evaluation   Cervical / Trunk Assessment Cervical / Trunk Assessment: Normal (Needs cues for upright posture in standing)   Communication Communication Communication: No difficulties   Cognition Arousal/Alertness: Awake/alert Behavior During Therapy: WFL for tasks assessed/performed Overall Cognitive Status: Impaired/Different from baseline Area of Impairment: Problem solving                     Memory: Decreased short-term memory (repeating questions) Following Commands: Follows one step commands inconsistently Safety/Judgement: Decreased awareness of safety   Problem Solving: Difficulty sequencing;Requires verbal cues;Requires tactile cues General Comments: A and O x 3 however continues to require repetition and limited carry over of instructions.  Needs repetition and would benefit from written instructions.   General Comments       Exercises General Exercises - Upper Extremity Shoulder Flexion: Theraband;Strengthening;10 reps;Both Theraband Level (Shoulder Flexion): Level 2 (Red) Shoulder Horizontal ABduction: Strengthening;Both;10 reps;Theraband Theraband Level (Shoulder Horizontal Abduction): Level 2 (Red) Elbow Extension:  Strengthening;Theraband;Both;10 reps Theraband Level (Elbow Extension): Level 2 (Red)   Shoulder Instructions      Home Living Family/patient expects to be discharged to:: Skilled nursing facility Living Arrangements: Alone Available Help at Discharge: Family Type of Home: House Home Access: Stairs to enter [employer] of Steps: 4 Entrance Stairs-Rails: Left Home Layout: Two level   Alternate Level Stairs-Rails: Right Bathroom Shower/Tub: Teacher, early years/pre: Standard Bathroom Accessibility: Yes   Home Equipment: Cane - single point;Bedside commode   Additional Comments: Pt now anticipates staying at son's home in [facility] after rehab for increased assistance/supervision.      Prior Functioning/Environment Level of Independence: Independent                 OT Problem List: Decreased strength;Pain;Decreased cognition;Decreased safety awareness;Decreased activity tolerance;Impaired balance (sitting and/or standing);Decreased knowledge of use of DME or AE;Decreased knowledge of precautions;Impaired UE functional use      OT Treatment/Interventions: Self-care/ADL training;Therapeutic exercise;Therapeutic activities;Cognitive remediation/compensation;DME and/or AE instruction;Patient/family education;Visual/perceptual remediation/compensation;Balance training    OT Goals(Current goals can be found in the care plan section) Acute Rehab OT Goals Patient Stated Goal: To walk and strength Legs. OT Goal Formulation: With patient Time For Goal Achievement: 11/18/20 Potential to Achieve Goals: Good ADL Goals Pt Will Perform Lower Body Bathing: sitting/lateral leans;sit to/from stand;with adaptive equipment;with supervision Pt Will Perform Lower Body Dressing: with adaptive equipment;with supervision;sit to/from stand;sitting/lateral leans Pt Will Transfer to Toilet: with supervision;stand pivot transfer;ambulating Pt Will Perform Toileting - Clothing  Manipulation and hygiene: with supervision;sitting/lateral leans;sit to/from stand Pt/caregiver will Perform Home Exercise Program: Increased strength;Both right and left upper extremity;With theraband;With written HEP provided  OT Frequency: Min 2X/week   Barriers to D/C:    Lives alone       Co-evaluation  AM-PAC OT "6 Clicks" Daily Activity     Outcome Measure Help from another person eating meals?: None Help from another person taking care of personal grooming?: A Little Help from another person toileting, which includes using toliet, bedpan, or urinal?: A Lot Help from another person bathing (including washing, rinsing, drying)?: A Lot Help from another person to put on and taking off regular upper body clothing?: A Little Help from another person to put on and taking off regular lower body clothing?: A Lot 6 Click Score: 16   End of Session Equipment Utilized During Treatment: Gait belt;Rolling walker Nurse Communication: Mobility status  Activity Tolerance: Patient limited by pain Patient left: in chair;with call bell/phone within reach;with chair alarm set;with family/visitor present  OT Visit Diagnosis: Unsteadiness on feet (R26.81);History of falling (Z91.81);Pain;Muscle weakness (generalized) (M62.81) Pain - Right/Left: Left Pain - part of body: Hip;Knee                Time: 9371-6967 OT Time Calculation (min): 39 min Charges:  OT General Charges $OT Visit: 1 Visit OT Evaluation $OT Eval Low Complexity: 1 Low OT Treatments $Therapeutic Activity: 8-22 mins $Therapeutic Exercise: 8-22 mins  Anderson [place], OT Acute Rehab Services Office: [phone] 11/04/2020  Julien Girt 11/04/2020, 10:08 AM

## 2020-11-04 NOTE — Progress Notes (Signed)
Physical Therapy Treatment Patient Details Name: [name] MRN: [id number] DOB: [birth year] Today's Date: 11/04/2020    History of Present Illness Patient is 74 y.o. female s/p Lt THA anterior approach on 10/29/20 with PMH significant for HTN, OA.  Pt for CT 11/01/20 and non-displaced acetabular fx found - pt now 50% PWB    PT Comments    POD # 6 pm session Pt is NOT progressing due to pain.  General Gait Details: pt NOT progressing with her gait due to intense pain consistanly same location L mid thigh distal to incision esp during weight bearing.  Pt also having difficulty adhering to [facility] despite several repeat VC's.  Multiple excuses given by pt ex socks, walker, flooring meantime still exhibiting coordination deficit and balance instability.  Too much instruction over stimulates her and she feels self defeated.  Despite pt's inconsistant behavior she consistanly c/o pain at same location.  Mid femur lateral and distal to incision. No c/o pain high in hip where Fx is located.  Follow Up Recommendations  SNF (in [place] close to son)     Equipment Recommendations  Rolling walker with 5" wheels    Recommendations for Other Services       Precautions / Restrictions Precautions Precautions: Fall Restrictions Weight Bearing Restrictions: Yes LLE Weight Bearing: Partial weight bearing LLE Partial Weight Bearing Percentage or Pounds: 50 Other Position/Activity Restrictions: 50% PWB LLE    Mobility  Bed Mobility               General bed mobility comments: OOB in recliner  Transfers Overall transfer level: Needs assistance Equipment used: Rolling walker (2 wheeled) Transfers: Sit to/from Stand Sit to Stand: Min assist Stand pivot transfers: Mod assist       General transfer comment: 50% VC's on proper hand placement to push up from recliner vs pull up on walker.  Ambulation/Gait Ambulation/Gait assistance: Min assist Gait Distance (Feet): 2 Feet Assistive device:  Rolling walker (2 wheeled) Gait Pattern/deviations: Step-to pattern;Trunk flexed;Decreased stance time - left Gait velocity: decreased   General Gait Details: pt NOT progressing with her gait due to intense pain consistanly same location L mid thigh distal to incision esp during weight bearing.  Pt also having difficulty adhering to [facility] despite several repeat VC's.  Multiple excuses given by pt ex socks, walker, flooring meantime still exhibiting coordination deficit and balance instability.  Too much instruction over stimulates her and she feels self defeated.  Despite pt's inconsistant behavior she consistanly c/o pain at same location.  Mid femur lateral and distal to incision. No c/o pain high in hip where Fx is located.   Stairs             Wheelchair Mobility    Modified Rankin (Stroke Patients Only)       Balance                                            Cognition Arousal/Alertness: Awake/alert   Overall Cognitive Status: Impaired/Different from baseline Area of Impairment: Problem solving;Safety/judgement;Following commands                     Memory: Decreased short-term memory Following Commands: Follows one step commands inconsistently Safety/Judgement: Decreased awareness of safety     General Comments: AxO x 3 but present with cognitive isuues of memeory, awareness, safety and carry  over.  Pt masking her deficits with anger today.  Short tempered.      Exercises  20 reps AP 10 reps knee presses 10 reps HS 05 reps ABD Followed by ICE    General Comments        Pertinent Vitals/Pain Pain Assessment: Faces Faces Pain Scale: Hurts whole lot Pain Location: L thigh distal to incision Pain Descriptors / Indicators: Grimacing;Guarding;Sore Pain Intervention(s): Monitored during session;Repositioned;Ice applied    Home Living                      Prior Function            PT Goals (current goals can now be found  in the care plan section) Progress towards PT goals: Progressing toward goals    Frequency    7X/week      PT Plan Current plan remains appropriate    Co-evaluation              AM-PAC PT "6 Clicks" Mobility   Outcome Measure  Help needed turning from your back to your side while in a flat bed without using bedrails?: A Little Help needed moving from lying on your back to sitting on the side of a flat bed without using bedrails?: A Little Help needed moving to and from a bed to a chair (including a wheelchair)?: A Lot Help needed standing up from a chair using your arms (e.g., wheelchair or bedside chair)?: A Lot Help needed to walk in hospital room?: A Lot Help needed climbing 3-5 steps with a railing? : Total 6 Click Score: 13    End of Session Equipment Utilized During Treatment: Gait belt Activity Tolerance: No increased pain Patient left: in chair;with call bell/phone within reach;with chair alarm set;with family/visitor present Nurse Communication: Mobility status PT Visit Diagnosis: Muscle weakness (generalized) (M62.81);Difficulty in walking, not elsewhere classified (R26.2);Pain Pain - Right/Left: Left Pain - part of body: Hip     Time: 1445-1510 PT Time Calculation (min) (ACUTE ONLY): 25 min  Charges:  $Gait Training: 8-22 mins $Therapeutic Exercise: 8-22 mins                     Rica Koyanagi  PTA Acute  Rehabilitation Services Pager      [phone] Office      [phone]

## 2020-11-04 NOTE — Care Management Important Message (Signed)
Important Message  Patient Details IM Letter given to the Patient. Name: [name] MRN: [id number] Date of Birth: 1946/03/30   Medicare Important Message Given:  Yes     Kerin Salen 11/04/2020, 1:45 PM

## 2020-11-04 NOTE — Progress Notes (Signed)
Physical Therapy Treatment Patient Details Name: [name] MRN: [id number] DOB: 01/30/1946 Today's Date: 11/04/2020    History of Present Illness Patient is 74 y.o. female s/p Lt THA anterior approach on 10/29/20 with PMH significant for HTN, OA.  Pt for CT 11/01/20 and non-displaced acetabular fx found - pt now 50% PWB    PT Comments    POD # 6 General Comments: AxO x 3 but still present with cognitive isuues of memeory, awareness, safety and carry over.  Pt masking her deficits with anger today.  Short tempered this session.  General transfer comment: 75% VC's on proper hand placement to push up from recliner vs pull up on walker.  5 min later, same VC needed.General Gait Details: 75% VC's on proper sequencing "walker, LEFT, RIGHT" and 100% VC's on proper PWB. Tactile, visual cueing with very little carry over.  Pt masking her confusion with anger this session.  "That's all I can do", "I not going any further" recliner puled up top pt from behind.  Pt indicated MAX pain at L thigh distal to to incision and "all the way doen". Pt present with inability to process PWB this session.  Too much cueing over loads her and not enough she is unable to sustain/retain. Assisted to recliner and performed some THR TE's that were tolerable.  "Other" son present during session and observered. Will return for a pm session later.    Follow Up Recommendations  SNF (in [place] close to son)     Equipment Recommendations  Rolling walker with 5" wheels    Recommendations for Other Services       Precautions / Restrictions Precautions Precautions: Fall Restrictions Weight Bearing Restrictions: Yes LLE Weight Bearing: Partial weight bearing LLE Partial Weight Bearing Percentage or Pounds: 50% Other Position/Activity Restrictions: 50% PWB LLE    Mobility  Bed Mobility Overal bed mobility: Needs Assistance Bed Mobility: Supine to Sit     Supine to sit: Min guard;HOB elevated     General bed  mobility comments: OOB in recliner  Transfers Overall transfer level: Needs assistance Equipment used: Rolling walker (2 wheeled) Transfers: Sit to/from Stand Sit to Stand: Min assist Stand pivot transfers: Mod assist       General transfer comment: 75% VC's on proper hand placement to push up from recliner vs pull up on walker.  5 min later, same VC needed.  Ambulation/Gait Ambulation/Gait assistance: Min assist Gait Distance (Feet): 2 Feet Assistive device: Rolling walker (2 wheeled) Gait Pattern/deviations: Step-to pattern;Trunk flexed;Decreased stance time - left Gait velocity: decreased   General Gait Details: 75% VC's on proper sequencing "walker, LEFT, RIGHT" and 100% VC's on proper PWB. Tactile, visual cueing with very little carry over.  Pt masking her confusion with anger this session.  "That's all I can do", "I not going any further" recliner puled up top pt from behind.  Pt indicated MAX pain at L thigh distal to to incision and "all the way doen". Pt present with inability to process PWB this session.  Too much cueing over loads her and not enough she is unable to sustain/retain.   Stairs             Wheelchair Mobility    Modified Rankin (Stroke Patients Only)       Balance Overall balance assessment: Needs assistance Sitting-balance support: Feet supported;No upper extremity supported Sitting balance-Leahy Scale: Fair     Standing balance support: Bilateral upper extremity supported Standing balance-Leahy Scale: Poor Standing balance comment:  Pt repeating "I'm not steady" with all standing.                            Cognition Arousal/Alertness: Awake/alert Behavior During Therapy: WFL for tasks assessed/performed Overall Cognitive Status: Impaired/Different from baseline Area of Impairment: Problem solving;Safety/judgement;Following commands                     Memory: Decreased short-term memory Following Commands: Follows  one step commands inconsistently Safety/Judgement: Decreased awareness of safety   Problem Solving: Difficulty sequencing;Requires verbal cues;Requires tactile cues General Comments: AxO x 3 but present with cognitive isuues of memeory, awareness, safety and carry over.  Pt masking her deficits with anger today.  Short tempered.      Exercises 20 reps AP 10 reps knee presses 10 reps HS using strap 10 reps ABD AAROM due to pain  General Comments        Pertinent Vitals/Pain Pain Assessment: Faces Faces Pain Scale: Hurts whole lot Pain Location: L thigh distal to incision Pain Descriptors / Indicators: Grimacing;Guarding;Sore Pain Intervention(s): Monitored during session;Repositioned;Ice applied    Home Living Family/patient expects to be discharged to:: Skilled nursing facility Living Arrangements: Alone Available Help at Discharge: Family Type of Home: House Home Access: Stairs to enter Entrance Stairs-Rails: Left Home Layout: Two level Home Equipment: Cane - single point;Bedside commode Additional Comments: Pt now anticipates staying at son's home in [facility] after rehab for increased assistance/supervision.    Prior Function Level of Independence: Independent          PT Goals (current goals can now be found in the care plan section) Acute Rehab PT Goals Patient Stated Goal: To walk and strength Legs. Progress towards PT goals: Progressing toward goals    Frequency    7X/week      PT Plan Current plan remains appropriate    Co-evaluation              AM-PAC PT "6 Clicks" Mobility   Outcome Measure  Help needed turning from your back to your side while in a flat bed without using bedrails?: A Little Help needed moving from lying on your back to sitting on the side of a flat bed without using bedrails?: A Little Help needed moving to and from a bed to a chair (including a wheelchair)?: A Lot Help needed standing up from a chair using your arms (e.g.,  wheelchair or bedside chair)?: A Lot Help needed to walk in hospital room?: A Lot Help needed climbing 3-5 steps with a railing? : Total 6 Click Score: 13    End of Session Equipment Utilized During Treatment: Gait belt Activity Tolerance: No increased pain Patient left: in chair;with call bell/phone within reach;with chair alarm set;with family/visitor present Nurse Communication: Mobility status PT Visit Diagnosis: Muscle weakness (generalized) (M62.81);Difficulty in walking, not elsewhere classified (R26.2);Pain Pain - Right/Left: Left Pain - part of body: Hip     Time: 1030-1105 PT Time Calculation (min) (ACUTE ONLY): 35 min  Charges:  $Gait Training: 8-22 mins $Therapeutic Exercise: 8-22 mins                     Rica Koyanagi  PTA Acute  Rehabilitation Services Pager      [phone] Office      [phone]

## 2020-11-04 NOTE — Care Plan (Signed)
Spoke with patient's son, [place], and PA over the weekend multiple times. New Fracture found. Slow progression with PT due to this. Will required SNF placement and family prefers her to move to [facility] where family is.  Son to get me the names of facilities and I will work with [place] to locate a facility and plan discharge.    [name], [place]

## 2020-11-04 NOTE — Progress Notes (Signed)
Patient and son requested to use patient supplied salonpas on left hip, Ellison Carwin PA paged with orders received D Mateo Flow RN

## 2020-11-04 NOTE — Progress Notes (Signed)
Patient ambulated once this evening to the [facility] and then to the bed from the recliner, very slow moving, due to tendency for patient to become confused at night and not use her call bell, the purewick was placed to allow for the patient to get some rest and remain safe, son agreeable and actually requested per previous shift nurse, will remove on morning rounds and assist patient up to the [facility].

## 2020-11-04 NOTE — NC FL2 (Signed)
[place] LEVEL OF CARE SCREENING TOOL     IDENTIFICATION  Patient Name: [name] Birthdate: 12/31/1945 Sex: female Admission Date (Current Location): 10/29/2020  [facility] and [place] Number:  Herbalist and Address:  [facility],  [address], [place]      Provider Number: [id number]  Attending Physician Name and Address:  Renette Butters, MD  Relative Name and Phone Number:  son, Naydeen Speirs @ [phone]    Current Level of Care: Hospital Recommended Level of Care: [place] Prior Approval Number:    Date Approved/Denied:   PASRR Number:    Discharge Plan: SNF    Current Diagnoses: Patient Active Problem List   Diagnosis Date Noted  . S/P total hip arthroplasty 10/29/2020    Orientation RESPIRATION BLADDER Height & Weight     Self,Time,Situation,Place  Normal Continent,External catheter Weight: 107 lb (48.5 kg) Height:  5\' 2"  (157.5 cm)  BEHAVIORAL SYMPTOMS/MOOD NEUROLOGICAL BOWEL NUTRITION STATUS      Continent    AMBULATORY STATUS COMMUNICATION OF NEEDS Skin   Extensive Assist Verbally Surgical wounds                       Personal Care Assistance Level of Assistance  Bathing,Dressing Bathing Assistance: Limited assistance   Dressing Assistance: Limited assistance     Functional Limitations Info             SPECIAL CARE FACTORS FREQUENCY  PT (By licensed PT),OT (By licensed OT)     PT Frequency: 5x/wk OT Frequency: 5x/wk            Contractures Contractures Info: Not present    Additional Factors Info  Code Status,Allergies Code Status Info: Full Allergies Info: see MAR           Current Medications (11/04/2020):  This is the current hospital active medication list Current Facility-Administered Medications  Medication Dose Route Frequency Provider Last Rate Last Admin  . acetaminophen (TYLENOL) tablet 1,000 mg  1,000 mg Oral Q6H PRN Rachael Fee,  PA-C   1,000 mg at 11/03/20 3662  . calcium-vitamin D (OSCAL WITH D) 500-200 MG-UNIT per tablet 2 tablet  2 tablet Oral Q breakfast Renette Butters, MD   2 tablet at 11/04/20 [phone]  . diclofenac Sodium (VOLTAREN) 1 % topical gel 2 g  2 g Topical QID Shepperson, Kirstin, PA-C   2 g at 11/04/20 0817  . docusate sodium (COLACE) capsule 100 mg  100 mg Oral BID Margy Clarks M, PA-C   100 mg at 11/04/20 5465  . hydrALAZINE (APRESOLINE) tablet 10 mg  10 mg Oral Q8H Ainsley Spinner, PA-C   10 mg at 11/04/20 0636  . menthol-cetylpyridinium (CEPACOL) lozenge 3 mg  1 lozenge Oral PRN Margy Clarks M, PA-C       Or  . phenol (CHLORASEPTIC) mouth spray 1 spray  1 spray Mouth/Throat PRN Margy Clarks M, PA-C      . metoCLOPramide (REGLAN) tablet 5-10 mg  5-10 mg Oral Q8H PRN Margy Clarks M, PA-C       Or  . metoCLOPramide (REGLAN) injection 5-10 mg  5-10 mg Intravenous Q8H PRN Margy Clarks M, PA-C      . metoprolol succinate (TOPROL-XL) 24 hr tablet 50 mg  50 mg Oral Q2000 Margy Clarks M, PA-C   50 mg at 11/03/20 2105  . ondansetron (ZOFRAN) tablet 4 mg  4 mg Oral Q6H PRN Wynetta Emery,  Shari Prows, PA-C       Or  . ondansetron [facility]) injection 4 mg  4 mg Intravenous Q6H PRN Rachael Fee, PA-C   4 mg at 10/30/20 3729  . ondansetron (ZOFRAN) tablet 4 mg  4 mg Oral Q8H PRN Margy Clarks M, PA-C      . pantoprazole (PROTONIX) EC tablet 40 mg  40 mg Oral Daily Margy Clarks M, [place]   40 mg at 11/04/20 0211  . polyethylene glycol (MIRALAX / GLYCOLAX) packet 17 g  17 g Oral BID Shepperson, Kirstin, PA-C   17 g at 11/04/20 0816  . promethazine (PHENERGAN) injection 12.5 mg  12.5 mg Intravenous Q6H PRN Margy Clarks M, PA-C      . rivaroxaban Alveda Reasons) tablet 10 mg  10 mg Oral Q breakfast Margy Clarks M, [place]   10 mg at 11/04/20 1552  . senna (SENOKOT) tablet 8.6 mg  1 tablet Oral Daily PRN Margy Clarks M, PA-C      . traMADol Veatrice Bourbon) tablet 50 mg  50 mg Oral Q6H Margy Clarks M, [place]   50  mg at 10/30/20 0802  . traMADol (ULTRAM) tablet 50 mg  50 mg Oral Q6H PRN Rachael Fee, PA-C   50 mg at 10/29/20 1517  . trimethoprim (TRIMPEX) tablet 100 mg  100 mg Oral QHS Margy Clarks M, PA-C   100 mg at 11/03/20 2105     Discharge Medications: Please see discharge summary for a list of discharge medications.  Relevant Imaging Results:  Relevant Lab Results:   Additional Information SS# [id number]  Lennart Pall, LCSW

## 2020-11-04 NOTE — Plan of Care (Signed)
  Problem: Nutrition: Goal: Adequate nutrition will be maintained Outcome: Progressing   Problem: Coping: Goal: Level of anxiety will decrease Outcome: Progressing   Problem: Pain Managment: Goal: General experience of comfort will improve Outcome: Progressing   

## 2020-11-05 ENCOUNTER — Inpatient Hospital Stay (HOSPITAL_COMMUNITY): Payer: Medicare Other

## 2020-11-05 MED ORDER — SCOPOLAMINE 1 MG/3DAYS TD PT72
1.0000 | MEDICATED_PATCH | TRANSDERMAL | Status: DC
  Administered 2020-11-05: 15:00:00 1.5 mg via TRANSDERMAL
  Filled 2020-11-05: qty 1

## 2020-11-05 NOTE — Progress Notes (Signed)
Checked on [name] at this time. She had called her son and he in turn called the unit w/ request to look in on her.  [name] voiced no needs or concerns at this time.

## 2020-11-05 NOTE — Progress Notes (Signed)
    Subjective: 74YOF POD # 7 s/p Left THA. Pt. Reports continued excruciating pain with weightbearing. Frustrated with her progress in general.  She is tolerating her diet well. Denies CP, SOB, Paraesthesia.   Objective:   VITALS:   Vitals:   11/04/20 0533 11/04/20 1400 11/04/20 2145 11/05/20 0505  BP: (!) 171/74 (!) 145/82 (!) 151/80 (!) 158/80  Pulse: 64 72 71 62  Resp: 16 17 18 16   Temp: 98 F (36.7 C) 98.3 F (36.8 C) 98.4 F (36.9 C) 98.2 F (36.8 C)  TempSrc:  Oral Oral   SpO2: 99% 98% 97% 100%  Weight:      Height:       CBC Latest Ref Rng & Units 11/03/2020 10/24/2020 09/29/2011  WBC 4.0 - 10.5 K/uL 8.0 6.3 -  Hemoglobin 12.0 - 15.0 g/dL 11.0(L) 13.1 13.3  Hematocrit 36.0 - 46.0 % 35.2(L) 42.1 -  Platelets 150 - 400 K/uL 303 277 -   BMP Latest Ref Rng & Units 11/03/2020 10/24/2020 08/06/2011  Glucose 70 - 99 mg/dL 130(H) 106(H) 100(H)  BUN 8 - 23 mg/dL 15 18 24(H)  Creatinine 0.44 - 1.00 mg/dL 1.08(H) 1.20(H) 1.12(H)  Sodium 135 - 145 mmol/L 136 142 135  Potassium 3.5 - 5.1 mmol/L 4.0 3.7 3.8  Chloride 98 - 111 mmol/L 99 104 101  CO2 22 - 32 mmol/L 23 27 26   Calcium 8.9 - 10.3 mg/dL 8.7(L) 9.3 9.4   Intake/Output      12/13 0701 12/14 0700 12/14 0701 12/15 0700   P.O. 240    Total Intake(mL/kg) 240 (4.9)    Urine (mL/kg/hr) 600 (0.5) 250 (1.5)   Stool  0   Total Output 600 250   Net -360 -250        Urine Occurrence 2 x 2 x   Stool Occurrence 0 x 1 x      Physical Exam: General: NAD.  Resting in bed comfortable.  Resp: No increased wob Cardio: regular rate and rhythm ABD soft Neurologically intact MSK Neurovascularly intact Sensation intact distally Intact pulses distally Dorsiflexion/Plantar flexion intact Incision: dressing C/D/I Neurovascular intact Compartment soft  Assessment: 7 Days Post-Op  S/P Procedure(s) (LRB): TOTAL HIP ARTHROPLASTY ANTERIOR APPROACH (Left) by Dr. Ernesta Amble. Murphy on 10/29/20  Principal Problem:   S/P  total hip arthroplasty  ADDITIONAL DIAGNOSIS:   Non-displaced fracture of the left acetabulum.   Primary osteoarthritis, status post total hip arthroplasty   Plan: Discharge to SNF   Pt. Set up for transfer to rehab tomorrow morning.  Incentive Spirometry Apply ice   Weight Bearing: Partial Weight Bearing @ 50% (PWB) LLE Dressings: Maintain Mepilex.   VTE prophylaxis: Xarelto 10mg  daily, SCDs, ambulation Dispo: Skilled Nursing Facility/Rehab   SNF will be medically necessary as she is not safe to stand without assistance yet much less ambulate without assistance.  Patient and her family are requesting SNF in [place] due to the proximity to her son [name].  Rachael Fee, PA-C 11/05/2020, 10:26 AM

## 2020-11-05 NOTE — Progress Notes (Signed)
Physical Therapy Treatment Patient Details Name: [name] MRN: [id number] DOB: 02/17/1946 Today's Date: 11/05/2020    History of Present Illness Patient is 74 y.o. female s/p Lt THA anterior approach on 10/29/20 with PMH significant for HTN, OA.  Pt for CT 11/01/20 and non-displaced acetabular fx found - pt now 50% PWB    PT Comments    POD # 7 pm session General bed mobility comments: OOB in recliner.  General transfer comment: 25% VC's on proper hand placement and 50% VC's on safety with turns to complete prior to sit.  Pt has urgency to sit due to pain/anxiety.  Severe posterior lean and poor self posture correction.  "I'm falling" stated pt but offered no self correction. General Gait Details: Used B platform EVA walker this session just to increase pt's gait ability and ensure PWB.  Also applied personal tennis shoes as suggested by Son.  Gait is still very difficult.  Poor weight shift, poor balance and poor coordination.  Therapy had to stabalize EVA walker to progress forward as pt was unable to.  Very limited distance given she is POD # 7.  Max c/o mid lateral thigh pain. Poor coordination.  Poor balance.  HIGH FALL RISK.  Then performed some standing TE's with walker.  Limited stance time.  Difficulty maintaining upright posture in a static stance.  Positioned in recliner to comfort and applied ICE.  Pt plans to D/C to SNF in [facility] near her Son.   Follow Up Recommendations  SNF     Equipment Recommendations  Rolling walker with 5" wheels    Recommendations for Other Services       Precautions / Restrictions Precautions Precautions: Fall Restrictions Weight Bearing Restrictions: Yes LLE Weight Bearing: Partial weight bearing Other Position/Activity Restrictions: 50% PWB LLE    Mobility  Bed Mobility Overal bed mobility: Needs Assistance Bed Mobility: Supine to Sit     Supine to sit: Min assist;Mod assist     General bed mobility comments: OOB in  recliner  Transfers Overall transfer level: Needs assistance Equipment used: Bilateral platform walker (EVA walker) Transfers: Sit to/from [facility] Sit to Stand: Min assist Stand pivot transfers: Mod assist       General transfer comment: 25% VC's on proper hand placement and 50% VC's on safety with turns to complete prior to sit.  Pt has urgency to sit due to pain/anxiety.  Severe posterior lean and poor self posture correction.  "I'm falling" stated pt but offered no self correction.  Ambulation/Gait Ambulation/Gait assistance: Mod assist Gait Distance (Feet): 16 Feet Assistive device: Bilateral platform walker (EVA walker) Gait Pattern/deviations: Step-to pattern;Trunk flexed;Decreased stance time - left Gait velocity: decreased   General Gait Details: Used B platform EVA walker this session just to increase pt's gait ability and ensure PWB.  Also applied personal tennis shoes as suggested by Son.  Gait is still very difficult.  Poor weight shift, poor balance and poor coordination.  Therapy had to stabalize EVA walker to progress forward as pt was unable to.  Very limited distance given she is POD # 7.  Max c/o mid lateral thigh pain.   Stairs             Wheelchair Mobility    Modified Rankin (Stroke Patients Only)       Balance  Cognition Arousal/Alertness: Awake/alert Behavior During Therapy: WFL for tasks assessed/performed                                   General Comments: slowly improving her mentally clarity and cognition.  Able to follow commands with less repeat.  Increased recall.      Exercises  5 reps all standing TE's LEFT LE only hip flex, hip ABD, knee bends Required Mod Assist for stability    General Comments        Pertinent Vitals/Pain Pain Assessment: Faces Faces Pain Scale: Hurts even more Pain Location: L thigh distal to incision Pain  Descriptors / Indicators: Grimacing;Guarding;Sore Pain Intervention(s): Monitored during session;Repositioned;Ice applied    Home Living                      Prior Function            PT Goals (current goals can now be found in the care plan section) Progress towards PT goals: Progressing toward goals    Frequency    7X/week      PT Plan Current plan remains appropriate    Co-evaluation              AM-PAC PT "6 Clicks" Mobility   Outcome Measure  Help needed turning from your back to your side while in a flat bed without using bedrails?: A Little Help needed moving from lying on your back to sitting on the side of a flat bed without using bedrails?: A Little Help needed moving to and from a bed to a chair (including a wheelchair)?: A Lot Help needed standing up from a chair using your arms (e.g., wheelchair or bedside chair)?: A Lot Help needed to walk in hospital room?: A Lot Help needed climbing 3-5 steps with a railing? : Total 6 Click Score: 13    End of Session Equipment Utilized During Treatment: Gait belt Activity Tolerance: No increased pain Patient left: in chair;with call bell/phone within reach;with chair alarm set;with family/visitor present Nurse Communication: Mobility status PT Visit Diagnosis: Muscle weakness (generalized) (M62.81);Difficulty in walking, not elsewhere classified (R26.2);Pain Pain - Right/Left: Left Pain - part of body: Hip     Time: 1445-1510 PT Time Calculation (min) (ACUTE ONLY): 25 min  Charges:  $Gait Training: 8-22 mins $Therapeutic Exercise: 8-22 mins                     Rica Koyanagi  PTA Acute  Rehabilitation Services Pager      [phone] Office      [phone]

## 2020-11-05 NOTE — Progress Notes (Signed)
Physical Therapy Treatment Patient Details Name: [name] MRN: [id number] DOB: October 09, 1946 Today's Date: 11/05/2020    History of Present Illness Patient is 74 y.o. female s/p Lt THA anterior approach on 10/29/20 with PMH significant for HTN, OA.  Pt for CT 11/01/20 and non-displaced acetabular fx found - pt now 50% PWB    PT Comments    POD # 7 am session Slowly improving her mentally clarity and cognition.  Able to follow commands with less repeat. Demonstrates  Increased recall.  Did repeat same question "How long does the pain last?"  L mif thigh pain slightly better today.  Pt using eptopical ointment rub. Still indicate most pain with L LE WBing.   Assisted OOB to amb to bathroom due to BM urge. General bed mobility comments: Min Assist for upper body and Mod Assist to complete scooting EOB.   General transfer comment: 50% VC's on proper hand placement to push up from recliner vs pull up on walker. Severe posterior LOB/lean during toilet transfer.  Pt unable to self correct.  Required + 2 assist for safety and assist for hygiene as pt was unable to self perfom and maintain a safe standing balance.  HIGH FALL RISK. General Gait Details: slow mobility progress with great difficulty adhering to her PWB as well as difficulty weightshifting and advancing either LE due to pain.  Pt repeats "how long does the pain last?" Assisted to bathroom required + 2 assist.  Pt struggling with coordination and balance with poor self correction. UNSTEADY.  HIGH FALL RISK Pt plans to D/C to SNF in [facility] near her son.    Follow Up Recommendations  SNF     Equipment Recommendations  Rolling walker with 5" wheels    Recommendations for Other Services       Precautions / Restrictions Precautions Precautions: Fall Restrictions Weight Bearing Restrictions: Yes LLE Weight Bearing: Partial weight bearing Other Position/Activity Restrictions: 50% PWB LLE    Mobility  Bed Mobility Overal bed mobility:  Needs Assistance Bed Mobility: Supine to Sit     Supine to sit: Min assist;Mod assist     General bed mobility comments: Min Assist for upper body and Mod Assist to complete scooting EOB  Transfers Overall transfer level: Needs assistance Equipment used: Rolling walker (2 wheeled) Transfers: Sit to/from [facility] Sit to Stand: Min assist Stand pivot transfers: Mod assist       General transfer comment: 50% VC's on proper hand placement to push up from recliner vs pull up on walker. Severe posterior LOB/lean during toilet transfer.  Pt unable to self correct.  Required + 2 assist for safety and assist for hygiene as pt was unable to self perfom and maintain a safe standing balance.  HIGH FALL RISK.  Ambulation/Gait Ambulation/Gait assistance: Mod assist;+2 physical assistance;+2 safety/equipment Gait Distance (Feet): 18 Feet (9 feet x 2 to and from bathroom) Assistive device: Rolling walker (2 wheeled) Gait Pattern/deviations: Step-to pattern;Trunk flexed;Decreased stance time - left Gait velocity: decreased   General Gait Details: slow mobility progress with great difficulty adhering to her PWB as well as difficulty weightshifting and advancing either LE due to pain.  Pt repeats "how long does the pain last?" Assisted to bathroom required + 2 assist.  Pt struggling with coordination and balance with poor self correction.  HIGH FALL RISK   Stairs             Wheelchair Mobility    Modified Rankin (Stroke Patients Only)  Balance                                            Cognition Arousal/Alertness: Awake/alert Behavior During Therapy: WFL for tasks assessed/performed                                   General Comments: slowly improving her mentally clarity and cognition.  Able to follow commands with less repeat.  Increased recall.      Exercises   Total Hip Replacement TE's following HEP Handout 10 reps  ankle pumps 05 reps knee presses 05 reps heel slides 05 reps SAQ's 05 reps ABD Instructed how to use a belt loop to assist  Followed by ICE     General Comments        Pertinent Vitals/Pain Pain Assessment: Faces Faces Pain Scale: Hurts even more Pain Location: L thigh distal to incision Pain Descriptors / Indicators: Grimacing;Guarding;Sore Pain Intervention(s): Monitored during session;Repositioned;Ice applied    Home Living                      Prior Function            PT Goals (current goals can now be found in the care plan section) Progress towards PT goals: Progressing toward goals    Frequency    7X/week      PT Plan Current plan remains appropriate    Co-evaluation              AM-PAC PT "6 Clicks" Mobility   Outcome Measure  Help needed turning from your back to your side while in a flat bed without using bedrails?: A Little Help needed moving from lying on your back to sitting on the side of a flat bed without using bedrails?: A Little Help needed moving to and from a bed to a chair (including a wheelchair)?: A Lot Help needed standing up from a chair using your arms (e.g., wheelchair or bedside chair)?: A Lot Help needed to walk in hospital room?: A Lot Help needed climbing 3-5 steps with a railing? : Total 6 Click Score: 13    End of Session Equipment Utilized During Treatment: Gait belt Activity Tolerance: No increased pain Patient left: in chair;with call bell/phone within reach;with chair alarm set;with family/visitor present Nurse Communication: Mobility status PT Visit Diagnosis: Muscle weakness (generalized) (M62.81);Difficulty in walking, not elsewhere classified (R26.2);Pain Pain - Right/Left: Left Pain - part of body: Hip     Time: 1200-1224 PT Time Calculation (min) (ACUTE ONLY): 24 min  Charges:  $Gait Training: 8-22 mins $Therapeutic Activity: 8-22 mins                     Rica Koyanagi  PTA Acute   Rehabilitation Services Pager      [phone] Office      [phone]

## 2020-11-05 NOTE — Plan of Care (Signed)
  Problem: Nutrition: Goal: Adequate nutrition will be maintained Outcome: Progressing   Problem: Elimination: Goal: Will not experience complications related to urinary retention Outcome: Progressing   Problem: Pain Managment: Goal: General experience of comfort will improve Outcome: Progressing   

## 2020-11-06 DIAGNOSIS — R059 Cough, unspecified: Secondary | ICD-10-CM | POA: Diagnosis not present

## 2020-11-06 DIAGNOSIS — Z96642 Presence of left artificial hip joint: Secondary | ICD-10-CM | POA: Diagnosis not present

## 2020-11-06 DIAGNOSIS — S32492D Other specified fracture of left acetabulum, subsequent encounter for fracture with routine healing: Secondary | ICD-10-CM | POA: Diagnosis not present

## 2020-11-06 DIAGNOSIS — I10 Essential (primary) hypertension: Secondary | ICD-10-CM | POA: Diagnosis not present

## 2020-11-06 DIAGNOSIS — T8484XD Pain due to internal orthopedic prosthetic devices, implants and grafts, subsequent encounter: Secondary | ICD-10-CM | POA: Diagnosis not present

## 2020-11-06 DIAGNOSIS — S069X0A Unspecified intracranial injury without loss of consciousness, initial encounter: Secondary | ICD-10-CM | POA: Diagnosis not present

## 2020-11-06 DIAGNOSIS — M1380 Other specified arthritis, unspecified site: Secondary | ICD-10-CM | POA: Diagnosis not present

## 2020-11-06 DIAGNOSIS — R0989 Other specified symptoms and signs involving the circulatory and respiratory systems: Secondary | ICD-10-CM | POA: Diagnosis not present

## 2020-11-06 DIAGNOSIS — R52 Pain, unspecified: Secondary | ICD-10-CM | POA: Diagnosis not present

## 2020-11-06 DIAGNOSIS — M79652 Pain in left thigh: Secondary | ICD-10-CM | POA: Diagnosis not present

## 2020-11-06 DIAGNOSIS — M25562 Pain in left knee: Secondary | ICD-10-CM | POA: Diagnosis not present

## 2020-11-06 DIAGNOSIS — K219 Gastro-esophageal reflux disease without esophagitis: Secondary | ICD-10-CM | POA: Diagnosis not present

## 2020-11-06 DIAGNOSIS — S32435A Nondisplaced fracture of anterior column [iliopubic] of left acetabulum, initial encounter for closed fracture: Secondary | ICD-10-CM | POA: Diagnosis not present

## 2020-11-06 DIAGNOSIS — Z299 Encounter for prophylactic measures, unspecified: Secondary | ICD-10-CM | POA: Diagnosis not present

## 2020-11-06 DIAGNOSIS — M25552 Pain in left hip: Secondary | ICD-10-CM | POA: Diagnosis not present

## 2020-11-06 DIAGNOSIS — Z111 Encounter for screening for respiratory tuberculosis: Secondary | ICD-10-CM | POA: Diagnosis not present

## 2020-11-06 DIAGNOSIS — S0990XA Unspecified injury of head, initial encounter: Secondary | ICD-10-CM | POA: Diagnosis not present

## 2020-11-06 DIAGNOSIS — Z471 Aftercare following joint replacement surgery: Secondary | ICD-10-CM | POA: Diagnosis not present

## 2020-11-06 DIAGNOSIS — W010XXA Fall on same level from slipping, tripping and stumbling without subsequent striking against object, initial encounter: Secondary | ICD-10-CM | POA: Diagnosis not present

## 2020-11-06 DIAGNOSIS — G3184 Mild cognitive impairment, so stated: Secondary | ICD-10-CM | POA: Diagnosis not present

## 2020-11-06 DIAGNOSIS — R41841 Cognitive communication deficit: Secondary | ICD-10-CM | POA: Diagnosis not present

## 2020-11-06 DIAGNOSIS — M6281 Muscle weakness (generalized): Secondary | ICD-10-CM | POA: Diagnosis not present

## 2020-11-06 DIAGNOSIS — R2681 Unsteadiness on feet: Secondary | ICD-10-CM | POA: Diagnosis not present

## 2020-11-06 NOTE — TOC Transition Note (Signed)
Transition of Care [facility]) - CM/SW Discharge Note   Patient Details  Name: LAQUANDA BICK MRN: [id number] Date of Birth: 10-Jul-1946  Transition of [facility] CM/SW Contact:  Lennart Pall, LCSW Phone Number: 11/06/2020, 10:12 AM   Clinical Narrative:    Pt dc today to [facility] in [place], [place] with transport via [employer].  No further TOC needs.   Final next level of care: Skilled Nursing Facility Barriers to Discharge: Barriers Resolved   Patient Goals and CMS Choice        Discharge Placement              Patient chooses bed at: Other - please specify in the comment section below: [facility] in [place], [place]) Patient to be transferred to facility by: [facility] Name of family member notified: son, Yetta Numbers Patient and family notified of of transfer: 11/05/20  Discharge Plan and Services                DME Arranged: N/A DME Agency: NA       HH Arranged: NA HH Agency: NA        Social Determinants of Health ([place]) Interventions     Readmission Risk Interventions No flowsheet data found.

## 2020-11-06 NOTE — Progress Notes (Addendum)
    Subjective: 74YOF POD # 8 s/p Left THA. Pt. Reports continued excruciating pain with weightbearing. Frustrated with her progress in general.  She is tolerating her diet well. Denies CP, SOB, Paraesthesia.   Objective:   VITALS:   Vitals:   11/05/20 0505 11/05/20 1308 11/05/20 2224 11/06/20 0519  BP: (!) 158/80 (!) 146/80 (!) 166/79 (!) 159/72  Pulse: 62 73 73 77  Resp: 16 18 15 15   Temp: 98.2 F (36.8 C) 97.8 F (36.6 C) 98.7 F (37.1 C) 98.3 F (36.8 C)  TempSrc:  Oral Oral Oral  SpO2: 100% 98% 98% 99%  Weight:      Height:       CBC Latest Ref Rng & Units 11/03/2020 10/24/2020 09/29/2011  WBC 4.0 - 10.5 K/uL 8.0 6.3 -  Hemoglobin 12.0 - 15.0 g/dL 11.0(L) 13.1 13.3  Hematocrit 36.0 - 46.0 % 35.2(L) 42.1 -  Platelets 150 - 400 K/uL 303 277 -   BMP Latest Ref Rng & Units 11/03/2020 10/24/2020 08/06/2011  Glucose 70 - 99 mg/dL 130(H) 106(H) 100(H)  BUN 8 - 23 mg/dL 15 18 24(H)  Creatinine 0.44 - 1.00 mg/dL 1.08(H) 1.20(H) 1.12(H)  Sodium 135 - 145 mmol/L 136 142 135  Potassium 3.5 - 5.1 mmol/L 4.0 3.7 3.8  Chloride 98 - 111 mmol/L 99 104 101  CO2 22 - 32 mmol/L 23 27 26   Calcium 8.9 - 10.3 mg/dL 8.7(L) 9.3 9.4   Intake/Output      12/14 0701 12/15 0700 12/15 0701 12/16 0700   P.O. 250    Total Intake(mL/kg) 250 (5.2)    Urine (mL/kg/hr) 350 (0.3)    Stool 0    Total Output 350    Net -100         Urine Occurrence 5 x    Stool Occurrence 8 x       Physical Exam: General: NAD.  Resting in bed comfortable.  Resp: No increased wob Cardio: regular rate and rhythm ABD soft Neurologically intact MSK Neurovascularly intact Sensation intact distally Intact pulses distally Dorsiflexion/Plantar flexion intact Incision: dressing C/D/I Neurovascular intact Compartment soft  Assessment: 8 Days Post-Op  S/P Procedure(s) (LRB): TOTAL HIP ARTHROPLASTY ANTERIOR APPROACH (Left) by Dr. Ernesta Amble. Murphy on 10/29/20  Principal Problem:   S/P total hip  arthroplasty  ADDITIONAL DIAGNOSIS:   Non-displaced fracture of the left acetabulum.   Most likely intraoperative (tramuatic)  Primary osteoarthritis, status post total hip arthroplasty   Plan: Discharge to SNF   Pt. Set up for transfer to rehab this morning.  Incentive Spirometry Apply ice as needed   Weight Bearing: Partial Weight Bearing @ 50% (PWB) LLE Dressings: Maintain Mepilex.   VTE prophylaxis: Xarelto 10mg  daily, SCDs, ambulation Dispo: Skilled Nursing Facility/Rehab   SNF will be medically necessary as she is not safe to stand without assistance yet much less ambulate without assistance.  Patient and her family are requesting SNF in [place] due to the proximity to her son [name].  Lisa Fee, PA-C 11/06/2020, 7:20 AM

## 2020-11-06 NOTE — Progress Notes (Signed)
Patient picked up via transport at 10:10am. Report called and given to [facility] from [facility]. [phone]

## 2020-11-07 DIAGNOSIS — M6281 Muscle weakness (generalized): Secondary | ICD-10-CM | POA: Diagnosis not present

## 2020-11-07 DIAGNOSIS — M1380 Other specified arthritis, unspecified site: Secondary | ICD-10-CM | POA: Diagnosis not present

## 2020-11-07 DIAGNOSIS — T8484XD Pain due to internal orthopedic prosthetic devices, implants and grafts, subsequent encounter: Secondary | ICD-10-CM | POA: Diagnosis not present

## 2020-11-07 DIAGNOSIS — R52 Pain, unspecified: Secondary | ICD-10-CM | POA: Diagnosis not present

## 2020-11-07 DIAGNOSIS — R2681 Unsteadiness on feet: Secondary | ICD-10-CM | POA: Diagnosis not present

## 2020-11-07 DIAGNOSIS — Z96642 Presence of left artificial hip joint: Secondary | ICD-10-CM | POA: Diagnosis not present

## 2020-11-07 DIAGNOSIS — Z471 Aftercare following joint replacement surgery: Secondary | ICD-10-CM | POA: Diagnosis not present

## 2020-11-07 DIAGNOSIS — S32492D Other specified fracture of left acetabulum, subsequent encounter for fracture with routine healing: Secondary | ICD-10-CM | POA: Diagnosis not present

## 2020-11-11 DIAGNOSIS — R2681 Unsteadiness on feet: Secondary | ICD-10-CM | POA: Diagnosis not present

## 2020-11-11 DIAGNOSIS — Z96642 Presence of left artificial hip joint: Secondary | ICD-10-CM | POA: Diagnosis not present

## 2020-11-11 DIAGNOSIS — Z471 Aftercare following joint replacement surgery: Secondary | ICD-10-CM | POA: Diagnosis not present

## 2020-11-11 DIAGNOSIS — M6281 Muscle weakness (generalized): Secondary | ICD-10-CM | POA: Diagnosis not present

## 2020-11-11 DIAGNOSIS — S32492D Other specified fracture of left acetabulum, subsequent encounter for fracture with routine healing: Secondary | ICD-10-CM | POA: Diagnosis not present

## 2020-11-12 DIAGNOSIS — M25552 Pain in left hip: Secondary | ICD-10-CM | POA: Diagnosis not present

## 2020-11-12 DIAGNOSIS — S32435A Nondisplaced fracture of anterior column [iliopubic] of left acetabulum, initial encounter for closed fracture: Secondary | ICD-10-CM | POA: Diagnosis not present

## 2020-11-14 DIAGNOSIS — S32492D Other specified fracture of left acetabulum, subsequent encounter for fracture with routine healing: Secondary | ICD-10-CM | POA: Diagnosis not present

## 2020-11-14 DIAGNOSIS — Z471 Aftercare following joint replacement surgery: Secondary | ICD-10-CM | POA: Diagnosis not present

## 2020-11-14 DIAGNOSIS — R2681 Unsteadiness on feet: Secondary | ICD-10-CM | POA: Diagnosis not present

## 2020-11-14 DIAGNOSIS — Z96642 Presence of left artificial hip joint: Secondary | ICD-10-CM | POA: Diagnosis not present

## 2020-11-14 DIAGNOSIS — M6281 Muscle weakness (generalized): Secondary | ICD-10-CM | POA: Diagnosis not present

## 2020-11-18 DIAGNOSIS — Z96642 Presence of left artificial hip joint: Secondary | ICD-10-CM | POA: Diagnosis not present

## 2020-11-18 DIAGNOSIS — M25562 Pain in left knee: Secondary | ICD-10-CM | POA: Diagnosis not present

## 2020-11-18 DIAGNOSIS — M6281 Muscle weakness (generalized): Secondary | ICD-10-CM | POA: Diagnosis not present

## 2020-11-18 DIAGNOSIS — Z471 Aftercare following joint replacement surgery: Secondary | ICD-10-CM | POA: Diagnosis not present

## 2020-11-18 DIAGNOSIS — R2681 Unsteadiness on feet: Secondary | ICD-10-CM | POA: Diagnosis not present

## 2020-11-18 DIAGNOSIS — S32492D Other specified fracture of left acetabulum, subsequent encounter for fracture with routine healing: Secondary | ICD-10-CM | POA: Diagnosis not present

## 2020-11-18 DIAGNOSIS — R059 Cough, unspecified: Secondary | ICD-10-CM | POA: Diagnosis not present

## 2020-11-20 DIAGNOSIS — M6281 Muscle weakness (generalized): Secondary | ICD-10-CM | POA: Diagnosis not present

## 2020-11-20 DIAGNOSIS — Z96642 Presence of left artificial hip joint: Secondary | ICD-10-CM | POA: Diagnosis not present

## 2020-11-20 DIAGNOSIS — R2681 Unsteadiness on feet: Secondary | ICD-10-CM | POA: Diagnosis not present

## 2020-11-20 DIAGNOSIS — R059 Cough, unspecified: Secondary | ICD-10-CM | POA: Diagnosis not present

## 2020-11-20 DIAGNOSIS — S32492D Other specified fracture of left acetabulum, subsequent encounter for fracture with routine healing: Secondary | ICD-10-CM | POA: Diagnosis not present

## 2020-11-20 DIAGNOSIS — Z471 Aftercare following joint replacement surgery: Secondary | ICD-10-CM | POA: Diagnosis not present

## 2020-11-23 DIAGNOSIS — Z299 Encounter for prophylactic measures, unspecified: Secondary | ICD-10-CM | POA: Diagnosis not present

## 2020-11-23 DIAGNOSIS — R52 Pain, unspecified: Secondary | ICD-10-CM | POA: Diagnosis not present

## 2020-11-23 DIAGNOSIS — I10 Essential (primary) hypertension: Secondary | ICD-10-CM | POA: Diagnosis not present

## 2020-11-23 DIAGNOSIS — M1380 Other specified arthritis, unspecified site: Secondary | ICD-10-CM | POA: Diagnosis not present

## 2020-11-23 DIAGNOSIS — Z96642 Presence of left artificial hip joint: Secondary | ICD-10-CM | POA: Diagnosis not present

## 2020-11-23 DIAGNOSIS — W010XXA Fall on same level from slipping, tripping and stumbling without subsequent striking against object, initial encounter: Secondary | ICD-10-CM | POA: Diagnosis not present

## 2020-11-23 DIAGNOSIS — S0990XA Unspecified injury of head, initial encounter: Secondary | ICD-10-CM | POA: Diagnosis not present

## 2020-11-23 DIAGNOSIS — Z111 Encounter for screening for respiratory tuberculosis: Secondary | ICD-10-CM | POA: Diagnosis not present

## 2020-11-23 DIAGNOSIS — T8484XD Pain due to internal orthopedic prosthetic devices, implants and grafts, subsequent encounter: Secondary | ICD-10-CM | POA: Diagnosis not present

## 2020-11-23 DIAGNOSIS — G3184 Mild cognitive impairment, so stated: Secondary | ICD-10-CM | POA: Diagnosis not present

## 2020-11-23 DIAGNOSIS — Z471 Aftercare following joint replacement surgery: Secondary | ICD-10-CM | POA: Diagnosis not present

## 2020-11-23 DIAGNOSIS — M25552 Pain in left hip: Secondary | ICD-10-CM | POA: Diagnosis not present

## 2020-11-23 DIAGNOSIS — S32435A Nondisplaced fracture of anterior column [iliopubic] of left acetabulum, initial encounter for closed fracture: Secondary | ICD-10-CM | POA: Diagnosis not present

## 2020-11-23 DIAGNOSIS — S32492D Other specified fracture of left acetabulum, subsequent encounter for fracture with routine healing: Secondary | ICD-10-CM | POA: Diagnosis not present

## 2020-11-23 DIAGNOSIS — R41841 Cognitive communication deficit: Secondary | ICD-10-CM | POA: Diagnosis not present

## 2020-11-23 DIAGNOSIS — S069X0A Unspecified intracranial injury without loss of consciousness, initial encounter: Secondary | ICD-10-CM | POA: Diagnosis not present

## 2020-11-23 DIAGNOSIS — K219 Gastro-esophageal reflux disease without esophagitis: Secondary | ICD-10-CM | POA: Diagnosis not present

## 2020-11-23 DIAGNOSIS — M6281 Muscle weakness (generalized): Secondary | ICD-10-CM | POA: Diagnosis not present

## 2020-11-23 DIAGNOSIS — R2681 Unsteadiness on feet: Secondary | ICD-10-CM | POA: Diagnosis not present

## 2020-11-26 DIAGNOSIS — S32435A Nondisplaced fracture of anterior column [iliopubic] of left acetabulum, initial encounter for closed fracture: Secondary | ICD-10-CM | POA: Diagnosis not present

## 2020-11-29 DIAGNOSIS — Z471 Aftercare following joint replacement surgery: Secondary | ICD-10-CM | POA: Diagnosis not present

## 2020-11-29 DIAGNOSIS — M6281 Muscle weakness (generalized): Secondary | ICD-10-CM | POA: Diagnosis not present

## 2020-11-29 DIAGNOSIS — S32492D Other specified fracture of left acetabulum, subsequent encounter for fracture with routine healing: Secondary | ICD-10-CM | POA: Diagnosis not present

## 2020-11-29 DIAGNOSIS — Z96642 Presence of left artificial hip joint: Secondary | ICD-10-CM | POA: Diagnosis not present

## 2020-11-29 DIAGNOSIS — R2681 Unsteadiness on feet: Secondary | ICD-10-CM | POA: Diagnosis not present

## 2020-12-04 DIAGNOSIS — Z471 Aftercare following joint replacement surgery: Secondary | ICD-10-CM | POA: Diagnosis not present

## 2020-12-04 DIAGNOSIS — R2681 Unsteadiness on feet: Secondary | ICD-10-CM | POA: Diagnosis not present

## 2020-12-04 DIAGNOSIS — S069X0A Unspecified intracranial injury without loss of consciousness, initial encounter: Secondary | ICD-10-CM | POA: Diagnosis not present

## 2020-12-04 DIAGNOSIS — Z96642 Presence of left artificial hip joint: Secondary | ICD-10-CM | POA: Diagnosis not present

## 2020-12-04 DIAGNOSIS — W010XXA Fall on same level from slipping, tripping and stumbling without subsequent striking against object, initial encounter: Secondary | ICD-10-CM | POA: Diagnosis not present

## 2020-12-04 DIAGNOSIS — S32492D Other specified fracture of left acetabulum, subsequent encounter for fracture with routine healing: Secondary | ICD-10-CM | POA: Diagnosis not present

## 2020-12-04 DIAGNOSIS — S0990XA Unspecified injury of head, initial encounter: Secondary | ICD-10-CM | POA: Diagnosis not present

## 2020-12-04 DIAGNOSIS — M6281 Muscle weakness (generalized): Secondary | ICD-10-CM | POA: Diagnosis not present

## 2020-12-20 DIAGNOSIS — S32435D Nondisplaced fracture of anterior column [iliopubic] of left acetabulum, subsequent encounter for fracture with routine healing: Secondary | ICD-10-CM | POA: Diagnosis not present

## 2020-12-23 DIAGNOSIS — R262 Difficulty in walking, not elsewhere classified: Secondary | ICD-10-CM | POA: Diagnosis not present

## 2020-12-23 DIAGNOSIS — Z471 Aftercare following joint replacement surgery: Secondary | ICD-10-CM | POA: Diagnosis not present

## 2020-12-23 DIAGNOSIS — M6281 Muscle weakness (generalized): Secondary | ICD-10-CM | POA: Diagnosis not present

## 2020-12-23 DIAGNOSIS — M159 Polyosteoarthritis, unspecified: Secondary | ICD-10-CM | POA: Diagnosis not present

## 2020-12-23 DIAGNOSIS — K219 Gastro-esophageal reflux disease without esophagitis: Secondary | ICD-10-CM | POA: Diagnosis not present

## 2020-12-23 DIAGNOSIS — G3184 Mild cognitive impairment, so stated: Secondary | ICD-10-CM | POA: Diagnosis not present

## 2020-12-23 DIAGNOSIS — R293 Abnormal posture: Secondary | ICD-10-CM | POA: Diagnosis not present

## 2020-12-24 DIAGNOSIS — M6281 Muscle weakness (generalized): Secondary | ICD-10-CM | POA: Diagnosis not present

## 2020-12-24 DIAGNOSIS — G3184 Mild cognitive impairment, so stated: Secondary | ICD-10-CM | POA: Diagnosis not present

## 2020-12-24 DIAGNOSIS — K219 Gastro-esophageal reflux disease without esophagitis: Secondary | ICD-10-CM | POA: Diagnosis not present

## 2020-12-24 DIAGNOSIS — R293 Abnormal posture: Secondary | ICD-10-CM | POA: Diagnosis not present

## 2020-12-24 DIAGNOSIS — M159 Polyosteoarthritis, unspecified: Secondary | ICD-10-CM | POA: Diagnosis not present

## 2020-12-24 DIAGNOSIS — Z471 Aftercare following joint replacement surgery: Secondary | ICD-10-CM | POA: Diagnosis not present

## 2020-12-24 DIAGNOSIS — R262 Difficulty in walking, not elsewhere classified: Secondary | ICD-10-CM | POA: Diagnosis not present

## 2020-12-25 DIAGNOSIS — R262 Difficulty in walking, not elsewhere classified: Secondary | ICD-10-CM | POA: Diagnosis not present

## 2020-12-25 DIAGNOSIS — M6281 Muscle weakness (generalized): Secondary | ICD-10-CM | POA: Diagnosis not present

## 2020-12-25 DIAGNOSIS — M159 Polyosteoarthritis, unspecified: Secondary | ICD-10-CM | POA: Diagnosis not present

## 2020-12-25 DIAGNOSIS — G3184 Mild cognitive impairment, so stated: Secondary | ICD-10-CM | POA: Diagnosis not present

## 2020-12-25 DIAGNOSIS — K219 Gastro-esophageal reflux disease without esophagitis: Secondary | ICD-10-CM | POA: Diagnosis not present

## 2020-12-25 DIAGNOSIS — Z471 Aftercare following joint replacement surgery: Secondary | ICD-10-CM | POA: Diagnosis not present

## 2020-12-26 DIAGNOSIS — M6281 Muscle weakness (generalized): Secondary | ICD-10-CM | POA: Diagnosis not present

## 2020-12-26 DIAGNOSIS — R262 Difficulty in walking, not elsewhere classified: Secondary | ICD-10-CM | POA: Diagnosis not present

## 2020-12-26 DIAGNOSIS — K219 Gastro-esophageal reflux disease without esophagitis: Secondary | ICD-10-CM | POA: Diagnosis not present

## 2020-12-26 DIAGNOSIS — M159 Polyosteoarthritis, unspecified: Secondary | ICD-10-CM | POA: Diagnosis not present

## 2020-12-26 DIAGNOSIS — G3184 Mild cognitive impairment, so stated: Secondary | ICD-10-CM | POA: Diagnosis not present

## 2020-12-26 DIAGNOSIS — Z471 Aftercare following joint replacement surgery: Secondary | ICD-10-CM | POA: Diagnosis not present

## 2020-12-27 DIAGNOSIS — G3184 Mild cognitive impairment, so stated: Secondary | ICD-10-CM | POA: Diagnosis not present

## 2020-12-27 DIAGNOSIS — M6281 Muscle weakness (generalized): Secondary | ICD-10-CM | POA: Diagnosis not present

## 2020-12-27 DIAGNOSIS — R262 Difficulty in walking, not elsewhere classified: Secondary | ICD-10-CM | POA: Diagnosis not present

## 2020-12-27 DIAGNOSIS — M159 Polyosteoarthritis, unspecified: Secondary | ICD-10-CM | POA: Diagnosis not present

## 2020-12-27 DIAGNOSIS — K219 Gastro-esophageal reflux disease without esophagitis: Secondary | ICD-10-CM | POA: Diagnosis not present

## 2020-12-27 DIAGNOSIS — Z471 Aftercare following joint replacement surgery: Secondary | ICD-10-CM | POA: Diagnosis not present

## 2020-12-30 DIAGNOSIS — Z471 Aftercare following joint replacement surgery: Secondary | ICD-10-CM | POA: Diagnosis not present

## 2020-12-30 DIAGNOSIS — R262 Difficulty in walking, not elsewhere classified: Secondary | ICD-10-CM | POA: Diagnosis not present

## 2020-12-30 DIAGNOSIS — K219 Gastro-esophageal reflux disease without esophagitis: Secondary | ICD-10-CM | POA: Diagnosis not present

## 2020-12-30 DIAGNOSIS — M159 Polyosteoarthritis, unspecified: Secondary | ICD-10-CM | POA: Diagnosis not present

## 2020-12-30 DIAGNOSIS — G3184 Mild cognitive impairment, so stated: Secondary | ICD-10-CM | POA: Diagnosis not present

## 2020-12-30 DIAGNOSIS — M6281 Muscle weakness (generalized): Secondary | ICD-10-CM | POA: Diagnosis not present

## 2020-12-31 DIAGNOSIS — Z471 Aftercare following joint replacement surgery: Secondary | ICD-10-CM | POA: Diagnosis not present

## 2020-12-31 DIAGNOSIS — K219 Gastro-esophageal reflux disease without esophagitis: Secondary | ICD-10-CM | POA: Diagnosis not present

## 2020-12-31 DIAGNOSIS — R262 Difficulty in walking, not elsewhere classified: Secondary | ICD-10-CM | POA: Diagnosis not present

## 2020-12-31 DIAGNOSIS — M6281 Muscle weakness (generalized): Secondary | ICD-10-CM | POA: Diagnosis not present

## 2020-12-31 DIAGNOSIS — G3184 Mild cognitive impairment, so stated: Secondary | ICD-10-CM | POA: Diagnosis not present

## 2020-12-31 DIAGNOSIS — M159 Polyosteoarthritis, unspecified: Secondary | ICD-10-CM | POA: Diagnosis not present

## 2021-01-01 DIAGNOSIS — M159 Polyosteoarthritis, unspecified: Secondary | ICD-10-CM | POA: Diagnosis not present

## 2021-01-01 DIAGNOSIS — Z471 Aftercare following joint replacement surgery: Secondary | ICD-10-CM | POA: Diagnosis not present

## 2021-01-01 DIAGNOSIS — G3184 Mild cognitive impairment, so stated: Secondary | ICD-10-CM | POA: Diagnosis not present

## 2021-01-01 DIAGNOSIS — M6281 Muscle weakness (generalized): Secondary | ICD-10-CM | POA: Diagnosis not present

## 2021-01-01 DIAGNOSIS — K219 Gastro-esophageal reflux disease without esophagitis: Secondary | ICD-10-CM | POA: Diagnosis not present

## 2021-01-01 DIAGNOSIS — R262 Difficulty in walking, not elsewhere classified: Secondary | ICD-10-CM | POA: Diagnosis not present

## 2021-01-03 DIAGNOSIS — M2012 Hallux valgus (acquired), left foot: Secondary | ICD-10-CM | POA: Diagnosis not present

## 2021-01-03 DIAGNOSIS — M6281 Muscle weakness (generalized): Secondary | ICD-10-CM | POA: Diagnosis not present

## 2021-01-03 DIAGNOSIS — M79674 Pain in right toe(s): Secondary | ICD-10-CM | POA: Diagnosis not present

## 2021-01-03 DIAGNOSIS — M2011 Hallux valgus (acquired), right foot: Secondary | ICD-10-CM | POA: Diagnosis not present

## 2021-01-03 DIAGNOSIS — M159 Polyosteoarthritis, unspecified: Secondary | ICD-10-CM | POA: Diagnosis not present

## 2021-01-03 DIAGNOSIS — B351 Tinea unguium: Secondary | ICD-10-CM | POA: Diagnosis not present

## 2021-01-03 DIAGNOSIS — Z7901 Long term (current) use of anticoagulants: Secondary | ICD-10-CM | POA: Diagnosis not present

## 2021-01-03 DIAGNOSIS — R262 Difficulty in walking, not elsewhere classified: Secondary | ICD-10-CM | POA: Diagnosis not present

## 2021-01-03 DIAGNOSIS — Z471 Aftercare following joint replacement surgery: Secondary | ICD-10-CM | POA: Diagnosis not present

## 2021-01-03 DIAGNOSIS — R0989 Other specified symptoms and signs involving the circulatory and respiratory systems: Secondary | ICD-10-CM | POA: Diagnosis not present

## 2021-01-03 DIAGNOSIS — R600 Localized edema: Secondary | ICD-10-CM | POA: Diagnosis not present

## 2021-01-03 DIAGNOSIS — G3184 Mild cognitive impairment, so stated: Secondary | ICD-10-CM | POA: Diagnosis not present

## 2021-01-03 DIAGNOSIS — K219 Gastro-esophageal reflux disease without esophagitis: Secondary | ICD-10-CM | POA: Diagnosis not present

## 2021-01-03 DIAGNOSIS — M79675 Pain in left toe(s): Secondary | ICD-10-CM | POA: Diagnosis not present

## 2021-01-06 DIAGNOSIS — Z471 Aftercare following joint replacement surgery: Secondary | ICD-10-CM | POA: Diagnosis not present

## 2021-01-06 DIAGNOSIS — M159 Polyosteoarthritis, unspecified: Secondary | ICD-10-CM | POA: Diagnosis not present

## 2021-01-06 DIAGNOSIS — K219 Gastro-esophageal reflux disease without esophagitis: Secondary | ICD-10-CM | POA: Diagnosis not present

## 2021-01-06 DIAGNOSIS — M6281 Muscle weakness (generalized): Secondary | ICD-10-CM | POA: Diagnosis not present

## 2021-01-06 DIAGNOSIS — G3184 Mild cognitive impairment, so stated: Secondary | ICD-10-CM | POA: Diagnosis not present

## 2021-01-06 DIAGNOSIS — R262 Difficulty in walking, not elsewhere classified: Secondary | ICD-10-CM | POA: Diagnosis not present

## 2021-01-08 DIAGNOSIS — K219 Gastro-esophageal reflux disease without esophagitis: Secondary | ICD-10-CM | POA: Diagnosis not present

## 2021-01-08 DIAGNOSIS — M6281 Muscle weakness (generalized): Secondary | ICD-10-CM | POA: Diagnosis not present

## 2021-01-08 DIAGNOSIS — M159 Polyosteoarthritis, unspecified: Secondary | ICD-10-CM | POA: Diagnosis not present

## 2021-01-08 DIAGNOSIS — G3184 Mild cognitive impairment, so stated: Secondary | ICD-10-CM | POA: Diagnosis not present

## 2021-01-08 DIAGNOSIS — R262 Difficulty in walking, not elsewhere classified: Secondary | ICD-10-CM | POA: Diagnosis not present

## 2021-01-08 DIAGNOSIS — Z471 Aftercare following joint replacement surgery: Secondary | ICD-10-CM | POA: Diagnosis not present

## 2021-01-10 DIAGNOSIS — Z471 Aftercare following joint replacement surgery: Secondary | ICD-10-CM | POA: Diagnosis not present

## 2021-01-10 DIAGNOSIS — K219 Gastro-esophageal reflux disease without esophagitis: Secondary | ICD-10-CM | POA: Diagnosis not present

## 2021-01-10 DIAGNOSIS — M159 Polyosteoarthritis, unspecified: Secondary | ICD-10-CM | POA: Diagnosis not present

## 2021-01-10 DIAGNOSIS — R262 Difficulty in walking, not elsewhere classified: Secondary | ICD-10-CM | POA: Diagnosis not present

## 2021-01-10 DIAGNOSIS — G3184 Mild cognitive impairment, so stated: Secondary | ICD-10-CM | POA: Diagnosis not present

## 2021-01-10 DIAGNOSIS — M6281 Muscle weakness (generalized): Secondary | ICD-10-CM | POA: Diagnosis not present

## 2021-01-13 DIAGNOSIS — Z471 Aftercare following joint replacement surgery: Secondary | ICD-10-CM | POA: Diagnosis not present

## 2021-01-13 DIAGNOSIS — K219 Gastro-esophageal reflux disease without esophagitis: Secondary | ICD-10-CM | POA: Diagnosis not present

## 2021-01-13 DIAGNOSIS — M159 Polyosteoarthritis, unspecified: Secondary | ICD-10-CM | POA: Diagnosis not present

## 2021-01-13 DIAGNOSIS — R262 Difficulty in walking, not elsewhere classified: Secondary | ICD-10-CM | POA: Diagnosis not present

## 2021-01-13 DIAGNOSIS — M6281 Muscle weakness (generalized): Secondary | ICD-10-CM | POA: Diagnosis not present

## 2021-01-13 DIAGNOSIS — G3184 Mild cognitive impairment, so stated: Secondary | ICD-10-CM | POA: Diagnosis not present

## 2021-01-15 DIAGNOSIS — M159 Polyosteoarthritis, unspecified: Secondary | ICD-10-CM | POA: Diagnosis not present

## 2021-01-15 DIAGNOSIS — M6281 Muscle weakness (generalized): Secondary | ICD-10-CM | POA: Diagnosis not present

## 2021-01-15 DIAGNOSIS — R262 Difficulty in walking, not elsewhere classified: Secondary | ICD-10-CM | POA: Diagnosis not present

## 2021-01-15 DIAGNOSIS — K219 Gastro-esophageal reflux disease without esophagitis: Secondary | ICD-10-CM | POA: Diagnosis not present

## 2021-01-15 DIAGNOSIS — Z471 Aftercare following joint replacement surgery: Secondary | ICD-10-CM | POA: Diagnosis not present

## 2021-01-15 DIAGNOSIS — G3184 Mild cognitive impairment, so stated: Secondary | ICD-10-CM | POA: Diagnosis not present

## 2021-01-17 DIAGNOSIS — M6281 Muscle weakness (generalized): Secondary | ICD-10-CM | POA: Diagnosis not present

## 2021-01-17 DIAGNOSIS — Z471 Aftercare following joint replacement surgery: Secondary | ICD-10-CM | POA: Diagnosis not present

## 2021-01-17 DIAGNOSIS — K219 Gastro-esophageal reflux disease without esophagitis: Secondary | ICD-10-CM | POA: Diagnosis not present

## 2021-01-17 DIAGNOSIS — M159 Polyosteoarthritis, unspecified: Secondary | ICD-10-CM | POA: Diagnosis not present

## 2021-01-17 DIAGNOSIS — R262 Difficulty in walking, not elsewhere classified: Secondary | ICD-10-CM | POA: Diagnosis not present

## 2021-01-17 DIAGNOSIS — G3184 Mild cognitive impairment, so stated: Secondary | ICD-10-CM | POA: Diagnosis not present

## 2021-01-20 DIAGNOSIS — M6281 Muscle weakness (generalized): Secondary | ICD-10-CM | POA: Diagnosis not present

## 2021-01-20 DIAGNOSIS — Z471 Aftercare following joint replacement surgery: Secondary | ICD-10-CM | POA: Diagnosis not present

## 2021-01-20 DIAGNOSIS — G3184 Mild cognitive impairment, so stated: Secondary | ICD-10-CM | POA: Diagnosis not present

## 2021-01-20 DIAGNOSIS — Z96642 Presence of left artificial hip joint: Secondary | ICD-10-CM | POA: Diagnosis not present

## 2021-01-20 DIAGNOSIS — K219 Gastro-esophageal reflux disease without esophagitis: Secondary | ICD-10-CM | POA: Diagnosis not present

## 2021-01-20 DIAGNOSIS — R262 Difficulty in walking, not elsewhere classified: Secondary | ICD-10-CM | POA: Diagnosis not present

## 2021-01-20 DIAGNOSIS — S32435D Nondisplaced fracture of anterior column [iliopubic] of left acetabulum, subsequent encounter for fracture with routine healing: Secondary | ICD-10-CM | POA: Diagnosis not present

## 2021-01-20 DIAGNOSIS — M159 Polyosteoarthritis, unspecified: Secondary | ICD-10-CM | POA: Diagnosis not present

## 2021-01-22 DIAGNOSIS — G3184 Mild cognitive impairment, so stated: Secondary | ICD-10-CM | POA: Diagnosis not present

## 2021-01-22 DIAGNOSIS — M6281 Muscle weakness (generalized): Secondary | ICD-10-CM | POA: Diagnosis not present

## 2021-01-22 DIAGNOSIS — Z471 Aftercare following joint replacement surgery: Secondary | ICD-10-CM | POA: Diagnosis not present

## 2021-01-22 DIAGNOSIS — M159 Polyosteoarthritis, unspecified: Secondary | ICD-10-CM | POA: Diagnosis not present

## 2021-01-22 DIAGNOSIS — R262 Difficulty in walking, not elsewhere classified: Secondary | ICD-10-CM | POA: Diagnosis not present

## 2021-01-22 DIAGNOSIS — K219 Gastro-esophageal reflux disease without esophagitis: Secondary | ICD-10-CM | POA: Diagnosis not present

## 2021-01-22 DIAGNOSIS — R293 Abnormal posture: Secondary | ICD-10-CM | POA: Diagnosis not present

## 2021-01-24 DIAGNOSIS — M6281 Muscle weakness (generalized): Secondary | ICD-10-CM | POA: Diagnosis not present

## 2021-01-24 DIAGNOSIS — G3184 Mild cognitive impairment, so stated: Secondary | ICD-10-CM | POA: Diagnosis not present

## 2021-01-24 DIAGNOSIS — R262 Difficulty in walking, not elsewhere classified: Secondary | ICD-10-CM | POA: Diagnosis not present

## 2021-01-24 DIAGNOSIS — K219 Gastro-esophageal reflux disease without esophagitis: Secondary | ICD-10-CM | POA: Diagnosis not present

## 2021-01-24 DIAGNOSIS — Z471 Aftercare following joint replacement surgery: Secondary | ICD-10-CM | POA: Diagnosis not present

## 2021-01-24 DIAGNOSIS — M159 Polyosteoarthritis, unspecified: Secondary | ICD-10-CM | POA: Diagnosis not present

## 2021-01-28 DIAGNOSIS — M159 Polyosteoarthritis, unspecified: Secondary | ICD-10-CM | POA: Diagnosis not present

## 2021-01-28 DIAGNOSIS — G3184 Mild cognitive impairment, so stated: Secondary | ICD-10-CM | POA: Diagnosis not present

## 2021-01-28 DIAGNOSIS — R262 Difficulty in walking, not elsewhere classified: Secondary | ICD-10-CM | POA: Diagnosis not present

## 2021-01-28 DIAGNOSIS — Z471 Aftercare following joint replacement surgery: Secondary | ICD-10-CM | POA: Diagnosis not present

## 2021-01-28 DIAGNOSIS — M6281 Muscle weakness (generalized): Secondary | ICD-10-CM | POA: Diagnosis not present

## 2021-01-28 DIAGNOSIS — K219 Gastro-esophageal reflux disease without esophagitis: Secondary | ICD-10-CM | POA: Diagnosis not present

## 2021-02-24 DIAGNOSIS — M6281 Muscle weakness (generalized): Secondary | ICD-10-CM | POA: Diagnosis not present

## 2021-02-24 DIAGNOSIS — R2681 Unsteadiness on feet: Secondary | ICD-10-CM | POA: Diagnosis not present

## 2021-02-24 DIAGNOSIS — Z9181 History of falling: Secondary | ICD-10-CM | POA: Diagnosis not present

## 2021-02-24 DIAGNOSIS — Z741 Need for assistance with personal care: Secondary | ICD-10-CM | POA: Diagnosis not present

## 2021-02-24 DIAGNOSIS — M159 Polyosteoarthritis, unspecified: Secondary | ICD-10-CM | POA: Diagnosis not present

## 2021-02-24 DIAGNOSIS — I10 Essential (primary) hypertension: Secondary | ICD-10-CM | POA: Diagnosis not present

## 2021-02-24 DIAGNOSIS — G3184 Mild cognitive impairment, so stated: Secondary | ICD-10-CM | POA: Diagnosis not present

## 2021-02-25 DIAGNOSIS — M159 Polyosteoarthritis, unspecified: Secondary | ICD-10-CM | POA: Diagnosis not present

## 2021-02-25 DIAGNOSIS — I10 Essential (primary) hypertension: Secondary | ICD-10-CM | POA: Diagnosis not present

## 2021-02-25 DIAGNOSIS — Z9181 History of falling: Secondary | ICD-10-CM | POA: Diagnosis not present

## 2021-02-25 DIAGNOSIS — G3184 Mild cognitive impairment, so stated: Secondary | ICD-10-CM | POA: Diagnosis not present

## 2021-02-25 DIAGNOSIS — R2681 Unsteadiness on feet: Secondary | ICD-10-CM | POA: Diagnosis not present

## 2021-02-25 DIAGNOSIS — Z741 Need for assistance with personal care: Secondary | ICD-10-CM | POA: Diagnosis not present

## 2021-02-26 DIAGNOSIS — Z741 Need for assistance with personal care: Secondary | ICD-10-CM | POA: Diagnosis not present

## 2021-02-26 DIAGNOSIS — R2681 Unsteadiness on feet: Secondary | ICD-10-CM | POA: Diagnosis not present

## 2021-02-26 DIAGNOSIS — Z9181 History of falling: Secondary | ICD-10-CM | POA: Diagnosis not present

## 2021-02-26 DIAGNOSIS — G3184 Mild cognitive impairment, so stated: Secondary | ICD-10-CM | POA: Diagnosis not present

## 2021-02-26 DIAGNOSIS — M159 Polyosteoarthritis, unspecified: Secondary | ICD-10-CM | POA: Diagnosis not present

## 2021-02-26 DIAGNOSIS — I10 Essential (primary) hypertension: Secondary | ICD-10-CM | POA: Diagnosis not present

## 2021-02-27 DIAGNOSIS — Z741 Need for assistance with personal care: Secondary | ICD-10-CM | POA: Diagnosis not present

## 2021-02-27 DIAGNOSIS — Z9181 History of falling: Secondary | ICD-10-CM | POA: Diagnosis not present

## 2021-02-27 DIAGNOSIS — G3184 Mild cognitive impairment, so stated: Secondary | ICD-10-CM | POA: Diagnosis not present

## 2021-02-27 DIAGNOSIS — M159 Polyosteoarthritis, unspecified: Secondary | ICD-10-CM | POA: Diagnosis not present

## 2021-02-27 DIAGNOSIS — I10 Essential (primary) hypertension: Secondary | ICD-10-CM | POA: Diagnosis not present

## 2021-02-27 DIAGNOSIS — R2681 Unsteadiness on feet: Secondary | ICD-10-CM | POA: Diagnosis not present

## 2021-02-28 DIAGNOSIS — M159 Polyosteoarthritis, unspecified: Secondary | ICD-10-CM | POA: Diagnosis not present

## 2021-02-28 DIAGNOSIS — I10 Essential (primary) hypertension: Secondary | ICD-10-CM | POA: Diagnosis not present

## 2021-02-28 DIAGNOSIS — Z741 Need for assistance with personal care: Secondary | ICD-10-CM | POA: Diagnosis not present

## 2021-02-28 DIAGNOSIS — Z9181 History of falling: Secondary | ICD-10-CM | POA: Diagnosis not present

## 2021-02-28 DIAGNOSIS — G3184 Mild cognitive impairment, so stated: Secondary | ICD-10-CM | POA: Diagnosis not present

## 2021-02-28 DIAGNOSIS — R2681 Unsteadiness on feet: Secondary | ICD-10-CM | POA: Diagnosis not present

## 2021-03-03 DIAGNOSIS — Z741 Need for assistance with personal care: Secondary | ICD-10-CM | POA: Diagnosis not present

## 2021-03-03 DIAGNOSIS — G3184 Mild cognitive impairment, so stated: Secondary | ICD-10-CM | POA: Diagnosis not present

## 2021-03-03 DIAGNOSIS — M159 Polyosteoarthritis, unspecified: Secondary | ICD-10-CM | POA: Diagnosis not present

## 2021-03-03 DIAGNOSIS — Z9181 History of falling: Secondary | ICD-10-CM | POA: Diagnosis not present

## 2021-03-03 DIAGNOSIS — R2681 Unsteadiness on feet: Secondary | ICD-10-CM | POA: Diagnosis not present

## 2021-03-03 DIAGNOSIS — I10 Essential (primary) hypertension: Secondary | ICD-10-CM | POA: Diagnosis not present

## 2021-03-04 DIAGNOSIS — Z9181 History of falling: Secondary | ICD-10-CM | POA: Diagnosis not present

## 2021-03-04 DIAGNOSIS — I10 Essential (primary) hypertension: Secondary | ICD-10-CM | POA: Diagnosis not present

## 2021-03-04 DIAGNOSIS — Z741 Need for assistance with personal care: Secondary | ICD-10-CM | POA: Diagnosis not present

## 2021-03-04 DIAGNOSIS — G3184 Mild cognitive impairment, so stated: Secondary | ICD-10-CM | POA: Diagnosis not present

## 2021-03-04 DIAGNOSIS — M159 Polyosteoarthritis, unspecified: Secondary | ICD-10-CM | POA: Diagnosis not present

## 2021-03-04 DIAGNOSIS — R2681 Unsteadiness on feet: Secondary | ICD-10-CM | POA: Diagnosis not present

## 2021-03-05 DIAGNOSIS — G3184 Mild cognitive impairment, so stated: Secondary | ICD-10-CM | POA: Diagnosis not present

## 2021-03-05 DIAGNOSIS — Z741 Need for assistance with personal care: Secondary | ICD-10-CM | POA: Diagnosis not present

## 2021-03-05 DIAGNOSIS — I10 Essential (primary) hypertension: Secondary | ICD-10-CM | POA: Diagnosis not present

## 2021-03-05 DIAGNOSIS — R2681 Unsteadiness on feet: Secondary | ICD-10-CM | POA: Diagnosis not present

## 2021-03-05 DIAGNOSIS — Z9181 History of falling: Secondary | ICD-10-CM | POA: Diagnosis not present

## 2021-03-05 DIAGNOSIS — M159 Polyosteoarthritis, unspecified: Secondary | ICD-10-CM | POA: Diagnosis not present

## 2021-03-06 DIAGNOSIS — Z9181 History of falling: Secondary | ICD-10-CM | POA: Diagnosis not present

## 2021-03-06 DIAGNOSIS — Z741 Need for assistance with personal care: Secondary | ICD-10-CM | POA: Diagnosis not present

## 2021-03-06 DIAGNOSIS — I10 Essential (primary) hypertension: Secondary | ICD-10-CM | POA: Diagnosis not present

## 2021-03-06 DIAGNOSIS — G3184 Mild cognitive impairment, so stated: Secondary | ICD-10-CM | POA: Diagnosis not present

## 2021-03-06 DIAGNOSIS — R2681 Unsteadiness on feet: Secondary | ICD-10-CM | POA: Diagnosis not present

## 2021-03-06 DIAGNOSIS — M159 Polyosteoarthritis, unspecified: Secondary | ICD-10-CM | POA: Diagnosis not present

## 2021-03-07 DIAGNOSIS — G3184 Mild cognitive impairment, so stated: Secondary | ICD-10-CM | POA: Diagnosis not present

## 2021-03-07 DIAGNOSIS — Z741 Need for assistance with personal care: Secondary | ICD-10-CM | POA: Diagnosis not present

## 2021-03-07 DIAGNOSIS — Z9181 History of falling: Secondary | ICD-10-CM | POA: Diagnosis not present

## 2021-03-07 DIAGNOSIS — M159 Polyosteoarthritis, unspecified: Secondary | ICD-10-CM | POA: Diagnosis not present

## 2021-03-07 DIAGNOSIS — R2681 Unsteadiness on feet: Secondary | ICD-10-CM | POA: Diagnosis not present

## 2021-03-07 DIAGNOSIS — I10 Essential (primary) hypertension: Secondary | ICD-10-CM | POA: Diagnosis not present

## 2021-03-10 DIAGNOSIS — M159 Polyosteoarthritis, unspecified: Secondary | ICD-10-CM | POA: Diagnosis not present

## 2021-03-10 DIAGNOSIS — I10 Essential (primary) hypertension: Secondary | ICD-10-CM | POA: Diagnosis not present

## 2021-03-10 DIAGNOSIS — G3184 Mild cognitive impairment, so stated: Secondary | ICD-10-CM | POA: Diagnosis not present

## 2021-03-10 DIAGNOSIS — Z9181 History of falling: Secondary | ICD-10-CM | POA: Diagnosis not present

## 2021-03-10 DIAGNOSIS — Z741 Need for assistance with personal care: Secondary | ICD-10-CM | POA: Diagnosis not present

## 2021-03-10 DIAGNOSIS — R2681 Unsteadiness on feet: Secondary | ICD-10-CM | POA: Diagnosis not present

## 2021-03-11 DIAGNOSIS — Z9181 History of falling: Secondary | ICD-10-CM | POA: Diagnosis not present

## 2021-03-11 DIAGNOSIS — Z741 Need for assistance with personal care: Secondary | ICD-10-CM | POA: Diagnosis not present

## 2021-03-11 DIAGNOSIS — M159 Polyosteoarthritis, unspecified: Secondary | ICD-10-CM | POA: Diagnosis not present

## 2021-03-11 DIAGNOSIS — G3184 Mild cognitive impairment, so stated: Secondary | ICD-10-CM | POA: Diagnosis not present

## 2021-03-11 DIAGNOSIS — I10 Essential (primary) hypertension: Secondary | ICD-10-CM | POA: Diagnosis not present

## 2021-03-11 DIAGNOSIS — R2681 Unsteadiness on feet: Secondary | ICD-10-CM | POA: Diagnosis not present

## 2021-03-13 DIAGNOSIS — Z9181 History of falling: Secondary | ICD-10-CM | POA: Diagnosis not present

## 2021-03-13 DIAGNOSIS — I10 Essential (primary) hypertension: Secondary | ICD-10-CM | POA: Diagnosis not present

## 2021-03-13 DIAGNOSIS — R2681 Unsteadiness on feet: Secondary | ICD-10-CM | POA: Diagnosis not present

## 2021-03-13 DIAGNOSIS — G3184 Mild cognitive impairment, so stated: Secondary | ICD-10-CM | POA: Diagnosis not present

## 2021-03-13 DIAGNOSIS — M159 Polyosteoarthritis, unspecified: Secondary | ICD-10-CM | POA: Diagnosis not present

## 2021-03-13 DIAGNOSIS — Z741 Need for assistance with personal care: Secondary | ICD-10-CM | POA: Diagnosis not present

## 2021-03-14 DIAGNOSIS — R2681 Unsteadiness on feet: Secondary | ICD-10-CM | POA: Diagnosis not present

## 2021-03-14 DIAGNOSIS — I10 Essential (primary) hypertension: Secondary | ICD-10-CM | POA: Diagnosis not present

## 2021-03-14 DIAGNOSIS — M159 Polyosteoarthritis, unspecified: Secondary | ICD-10-CM | POA: Diagnosis not present

## 2021-03-14 DIAGNOSIS — Z741 Need for assistance with personal care: Secondary | ICD-10-CM | POA: Diagnosis not present

## 2021-03-14 DIAGNOSIS — G3184 Mild cognitive impairment, so stated: Secondary | ICD-10-CM | POA: Diagnosis not present

## 2021-03-14 DIAGNOSIS — Z9181 History of falling: Secondary | ICD-10-CM | POA: Diagnosis not present

## 2021-03-17 DIAGNOSIS — Z741 Need for assistance with personal care: Secondary | ICD-10-CM | POA: Diagnosis not present

## 2021-03-17 DIAGNOSIS — Z9181 History of falling: Secondary | ICD-10-CM | POA: Diagnosis not present

## 2021-03-17 DIAGNOSIS — R2681 Unsteadiness on feet: Secondary | ICD-10-CM | POA: Diagnosis not present

## 2021-03-17 DIAGNOSIS — I10 Essential (primary) hypertension: Secondary | ICD-10-CM | POA: Diagnosis not present

## 2021-03-17 DIAGNOSIS — M159 Polyosteoarthritis, unspecified: Secondary | ICD-10-CM | POA: Diagnosis not present

## 2021-03-17 DIAGNOSIS — G3184 Mild cognitive impairment, so stated: Secondary | ICD-10-CM | POA: Diagnosis not present

## 2021-03-18 DIAGNOSIS — G3184 Mild cognitive impairment, so stated: Secondary | ICD-10-CM | POA: Diagnosis not present

## 2021-03-18 DIAGNOSIS — M159 Polyosteoarthritis, unspecified: Secondary | ICD-10-CM | POA: Diagnosis not present

## 2021-03-18 DIAGNOSIS — Z741 Need for assistance with personal care: Secondary | ICD-10-CM | POA: Diagnosis not present

## 2021-03-18 DIAGNOSIS — R2681 Unsteadiness on feet: Secondary | ICD-10-CM | POA: Diagnosis not present

## 2021-03-18 DIAGNOSIS — I10 Essential (primary) hypertension: Secondary | ICD-10-CM | POA: Diagnosis not present

## 2021-03-18 DIAGNOSIS — Z9181 History of falling: Secondary | ICD-10-CM | POA: Diagnosis not present

## 2021-03-19 DIAGNOSIS — I10 Essential (primary) hypertension: Secondary | ICD-10-CM | POA: Diagnosis not present

## 2021-03-19 DIAGNOSIS — Z9181 History of falling: Secondary | ICD-10-CM | POA: Diagnosis not present

## 2021-03-19 DIAGNOSIS — Z741 Need for assistance with personal care: Secondary | ICD-10-CM | POA: Diagnosis not present

## 2021-03-19 DIAGNOSIS — G3184 Mild cognitive impairment, so stated: Secondary | ICD-10-CM | POA: Diagnosis not present

## 2021-03-19 DIAGNOSIS — M159 Polyosteoarthritis, unspecified: Secondary | ICD-10-CM | POA: Diagnosis not present

## 2021-03-19 DIAGNOSIS — R2681 Unsteadiness on feet: Secondary | ICD-10-CM | POA: Diagnosis not present

## 2021-03-20 DIAGNOSIS — I10 Essential (primary) hypertension: Secondary | ICD-10-CM | POA: Diagnosis not present

## 2021-03-20 DIAGNOSIS — M159 Polyosteoarthritis, unspecified: Secondary | ICD-10-CM | POA: Diagnosis not present

## 2021-03-20 DIAGNOSIS — G3184 Mild cognitive impairment, so stated: Secondary | ICD-10-CM | POA: Diagnosis not present

## 2021-03-20 DIAGNOSIS — R2681 Unsteadiness on feet: Secondary | ICD-10-CM | POA: Diagnosis not present

## 2021-03-20 DIAGNOSIS — Z741 Need for assistance with personal care: Secondary | ICD-10-CM | POA: Diagnosis not present

## 2021-03-20 DIAGNOSIS — Z9181 History of falling: Secondary | ICD-10-CM | POA: Diagnosis not present

## 2021-03-21 DIAGNOSIS — I10 Essential (primary) hypertension: Secondary | ICD-10-CM | POA: Diagnosis not present

## 2021-03-21 DIAGNOSIS — R0989 Other specified symptoms and signs involving the circulatory and respiratory systems: Secondary | ICD-10-CM | POA: Diagnosis not present

## 2021-03-21 DIAGNOSIS — Z7901 Long term (current) use of anticoagulants: Secondary | ICD-10-CM | POA: Diagnosis not present

## 2021-03-21 DIAGNOSIS — M79675 Pain in left toe(s): Secondary | ICD-10-CM | POA: Diagnosis not present

## 2021-03-21 DIAGNOSIS — R600 Localized edema: Secondary | ICD-10-CM | POA: Diagnosis not present

## 2021-03-21 DIAGNOSIS — Z9181 History of falling: Secondary | ICD-10-CM | POA: Diagnosis not present

## 2021-03-21 DIAGNOSIS — M2011 Hallux valgus (acquired), right foot: Secondary | ICD-10-CM | POA: Diagnosis not present

## 2021-03-21 DIAGNOSIS — G3184 Mild cognitive impairment, so stated: Secondary | ICD-10-CM | POA: Diagnosis not present

## 2021-03-21 DIAGNOSIS — M79674 Pain in right toe(s): Secondary | ICD-10-CM | POA: Diagnosis not present

## 2021-03-21 DIAGNOSIS — M159 Polyosteoarthritis, unspecified: Secondary | ICD-10-CM | POA: Diagnosis not present

## 2021-03-21 DIAGNOSIS — Z741 Need for assistance with personal care: Secondary | ICD-10-CM | POA: Diagnosis not present

## 2021-03-21 DIAGNOSIS — B351 Tinea unguium: Secondary | ICD-10-CM | POA: Diagnosis not present

## 2021-03-21 DIAGNOSIS — R2681 Unsteadiness on feet: Secondary | ICD-10-CM | POA: Diagnosis not present

## 2021-03-21 DIAGNOSIS — M2012 Hallux valgus (acquired), left foot: Secondary | ICD-10-CM | POA: Diagnosis not present

## 2021-03-24 DIAGNOSIS — Z9181 History of falling: Secondary | ICD-10-CM | POA: Diagnosis not present

## 2021-03-24 DIAGNOSIS — I10 Essential (primary) hypertension: Secondary | ICD-10-CM | POA: Diagnosis not present

## 2021-03-24 DIAGNOSIS — R2681 Unsteadiness on feet: Secondary | ICD-10-CM | POA: Diagnosis not present

## 2021-03-24 DIAGNOSIS — M6281 Muscle weakness (generalized): Secondary | ICD-10-CM | POA: Diagnosis not present

## 2021-03-24 DIAGNOSIS — M159 Polyosteoarthritis, unspecified: Secondary | ICD-10-CM | POA: Diagnosis not present

## 2021-03-24 DIAGNOSIS — Z741 Need for assistance with personal care: Secondary | ICD-10-CM | POA: Diagnosis not present

## 2021-03-24 DIAGNOSIS — G3184 Mild cognitive impairment, so stated: Secondary | ICD-10-CM | POA: Diagnosis not present

## 2021-03-25 DIAGNOSIS — Z741 Need for assistance with personal care: Secondary | ICD-10-CM | POA: Diagnosis not present

## 2021-03-25 DIAGNOSIS — M159 Polyosteoarthritis, unspecified: Secondary | ICD-10-CM | POA: Diagnosis not present

## 2021-03-25 DIAGNOSIS — I10 Essential (primary) hypertension: Secondary | ICD-10-CM | POA: Diagnosis not present

## 2021-03-25 DIAGNOSIS — G3184 Mild cognitive impairment, so stated: Secondary | ICD-10-CM | POA: Diagnosis not present

## 2021-03-25 DIAGNOSIS — R2681 Unsteadiness on feet: Secondary | ICD-10-CM | POA: Diagnosis not present

## 2021-03-25 DIAGNOSIS — Z9181 History of falling: Secondary | ICD-10-CM | POA: Diagnosis not present

## 2021-04-26 DIAGNOSIS — Z0189 Encounter for other specified special examinations: Secondary | ICD-10-CM | POA: Diagnosis not present

## 2021-04-26 DIAGNOSIS — S79912A Unspecified injury of left hip, initial encounter: Secondary | ICD-10-CM | POA: Diagnosis not present

## 2021-04-26 DIAGNOSIS — F17210 Nicotine dependence, cigarettes, uncomplicated: Secondary | ICD-10-CM | POA: Diagnosis not present

## 2021-04-26 DIAGNOSIS — Z96642 Presence of left artificial hip joint: Secondary | ICD-10-CM | POA: Diagnosis not present

## 2021-04-26 DIAGNOSIS — M25552 Pain in left hip: Secondary | ICD-10-CM | POA: Diagnosis not present

## 2021-04-26 DIAGNOSIS — I10 Essential (primary) hypertension: Secondary | ICD-10-CM | POA: Diagnosis not present

## 2021-05-01 DIAGNOSIS — M6281 Muscle weakness (generalized): Secondary | ICD-10-CM | POA: Diagnosis not present

## 2021-05-01 DIAGNOSIS — M25552 Pain in left hip: Secondary | ICD-10-CM | POA: Diagnosis not present

## 2021-05-01 DIAGNOSIS — Z7409 Other reduced mobility: Secondary | ICD-10-CM | POA: Diagnosis not present

## 2021-05-01 DIAGNOSIS — Z9181 History of falling: Secondary | ICD-10-CM | POA: Diagnosis not present

## 2021-05-01 DIAGNOSIS — R2681 Unsteadiness on feet: Secondary | ICD-10-CM | POA: Diagnosis not present

## 2021-05-01 DIAGNOSIS — M159 Polyosteoarthritis, unspecified: Secondary | ICD-10-CM | POA: Diagnosis not present

## 2021-05-01 DIAGNOSIS — G3184 Mild cognitive impairment, so stated: Secondary | ICD-10-CM | POA: Diagnosis not present

## 2021-05-01 DIAGNOSIS — R262 Difficulty in walking, not elsewhere classified: Secondary | ICD-10-CM | POA: Diagnosis not present

## 2021-05-06 DIAGNOSIS — M159 Polyosteoarthritis, unspecified: Secondary | ICD-10-CM | POA: Diagnosis not present

## 2021-05-06 DIAGNOSIS — Z9181 History of falling: Secondary | ICD-10-CM | POA: Diagnosis not present

## 2021-05-06 DIAGNOSIS — R2681 Unsteadiness on feet: Secondary | ICD-10-CM | POA: Diagnosis not present

## 2021-05-06 DIAGNOSIS — Z7409 Other reduced mobility: Secondary | ICD-10-CM | POA: Diagnosis not present

## 2021-05-06 DIAGNOSIS — G3184 Mild cognitive impairment, so stated: Secondary | ICD-10-CM | POA: Diagnosis not present

## 2021-05-06 DIAGNOSIS — M25552 Pain in left hip: Secondary | ICD-10-CM | POA: Diagnosis not present

## 2021-05-07 DIAGNOSIS — Z03818 Encounter for observation for suspected exposure to other biological agents ruled out: Secondary | ICD-10-CM | POA: Diagnosis not present

## 2021-05-16 DIAGNOSIS — Z03818 Encounter for observation for suspected exposure to other biological agents ruled out: Secondary | ICD-10-CM | POA: Diagnosis not present

## 2021-05-19 DIAGNOSIS — Z23 Encounter for immunization: Secondary | ICD-10-CM | POA: Diagnosis not present

## 2021-06-02 DIAGNOSIS — Z96642 Presence of left artificial hip joint: Secondary | ICD-10-CM | POA: Diagnosis not present

## 2021-06-02 DIAGNOSIS — S42001A Fracture of unspecified part of right clavicle, initial encounter for closed fracture: Secondary | ICD-10-CM | POA: Diagnosis not present

## 2021-06-02 DIAGNOSIS — S42031A Displaced fracture of lateral end of right clavicle, initial encounter for closed fracture: Secondary | ICD-10-CM | POA: Diagnosis not present

## 2021-06-02 DIAGNOSIS — R54 Age-related physical debility: Secondary | ICD-10-CM | POA: Diagnosis not present

## 2021-06-02 DIAGNOSIS — M25519 Pain in unspecified shoulder: Secondary | ICD-10-CM | POA: Diagnosis not present

## 2021-06-02 DIAGNOSIS — W19XXXA Unspecified fall, initial encounter: Secondary | ICD-10-CM | POA: Diagnosis not present

## 2021-06-02 DIAGNOSIS — Z888 Allergy status to other drugs, medicaments and biological substances status: Secondary | ICD-10-CM | POA: Diagnosis not present

## 2021-06-02 DIAGNOSIS — I10 Essential (primary) hypertension: Secondary | ICD-10-CM | POA: Diagnosis not present

## 2021-06-02 DIAGNOSIS — S42032A Displaced fracture of lateral end of left clavicle, initial encounter for closed fracture: Secondary | ICD-10-CM | POA: Diagnosis not present

## 2021-06-02 DIAGNOSIS — Z7901 Long term (current) use of anticoagulants: Secondary | ICD-10-CM | POA: Diagnosis not present

## 2021-06-02 DIAGNOSIS — Z885 Allergy status to narcotic agent status: Secondary | ICD-10-CM | POA: Diagnosis not present

## 2021-06-02 DIAGNOSIS — S42002A Fracture of unspecified part of left clavicle, initial encounter for closed fracture: Secondary | ICD-10-CM | POA: Diagnosis not present

## 2021-06-02 DIAGNOSIS — Z886 Allergy status to analgesic agent status: Secondary | ICD-10-CM | POA: Diagnosis not present

## 2021-06-02 DIAGNOSIS — Z79899 Other long term (current) drug therapy: Secondary | ICD-10-CM | POA: Diagnosis not present

## 2021-06-02 DIAGNOSIS — J984 Other disorders of lung: Secondary | ICD-10-CM | POA: Diagnosis not present

## 2021-06-02 DIAGNOSIS — Y939 Activity, unspecified: Secondary | ICD-10-CM | POA: Diagnosis not present

## 2021-06-02 DIAGNOSIS — Z20822 Contact with and (suspected) exposure to covid-19: Secondary | ICD-10-CM | POA: Diagnosis not present

## 2021-06-03 DIAGNOSIS — S7291XA Unspecified fracture of right femur, initial encounter for closed fracture: Secondary | ICD-10-CM | POA: Diagnosis not present

## 2021-06-03 DIAGNOSIS — Z9181 History of falling: Secondary | ICD-10-CM | POA: Diagnosis not present

## 2021-06-03 DIAGNOSIS — Z20822 Contact with and (suspected) exposure to covid-19: Secondary | ICD-10-CM | POA: Diagnosis not present

## 2021-06-03 DIAGNOSIS — R4182 Altered mental status, unspecified: Secondary | ICD-10-CM | POA: Diagnosis not present

## 2021-06-03 DIAGNOSIS — Z87440 Personal history of urinary (tract) infections: Secondary | ICD-10-CM | POA: Diagnosis not present

## 2021-06-03 DIAGNOSIS — D649 Anemia, unspecified: Secondary | ICD-10-CM | POA: Diagnosis not present

## 2021-06-03 DIAGNOSIS — F32A Depression, unspecified: Secondary | ICD-10-CM | POA: Diagnosis not present

## 2021-06-03 DIAGNOSIS — S42001D Fracture of unspecified part of right clavicle, subsequent encounter for fracture with routine healing: Secondary | ICD-10-CM | POA: Diagnosis not present

## 2021-06-03 DIAGNOSIS — N390 Urinary tract infection, site not specified: Secondary | ICD-10-CM | POA: Diagnosis not present

## 2021-06-03 DIAGNOSIS — F329 Major depressive disorder, single episode, unspecified: Secondary | ICD-10-CM | POA: Diagnosis not present

## 2021-06-03 DIAGNOSIS — R2689 Other abnormalities of gait and mobility: Secondary | ICD-10-CM | POA: Diagnosis not present

## 2021-06-03 DIAGNOSIS — W19XXXA Unspecified fall, initial encounter: Secondary | ICD-10-CM | POA: Diagnosis not present

## 2021-06-03 DIAGNOSIS — Z743 Need for continuous supervision: Secondary | ICD-10-CM | POA: Diagnosis not present

## 2021-06-03 DIAGNOSIS — S42032A Displaced fracture of lateral end of left clavicle, initial encounter for closed fracture: Secondary | ICD-10-CM | POA: Diagnosis not present

## 2021-06-03 DIAGNOSIS — Z7901 Long term (current) use of anticoagulants: Secondary | ICD-10-CM | POA: Diagnosis not present

## 2021-06-03 DIAGNOSIS — Z96642 Presence of left artificial hip joint: Secondary | ICD-10-CM | POA: Diagnosis not present

## 2021-06-03 DIAGNOSIS — S42031A Displaced fracture of lateral end of right clavicle, initial encounter for closed fracture: Secondary | ICD-10-CM | POA: Diagnosis not present

## 2021-06-03 DIAGNOSIS — S42002D Fracture of unspecified part of left clavicle, subsequent encounter for fracture with routine healing: Secondary | ICD-10-CM | POA: Diagnosis not present

## 2021-06-03 DIAGNOSIS — M25519 Pain in unspecified shoulder: Secondary | ICD-10-CM | POA: Diagnosis not present

## 2021-06-03 DIAGNOSIS — R269 Unspecified abnormalities of gait and mobility: Secondary | ICD-10-CM | POA: Diagnosis not present

## 2021-06-03 DIAGNOSIS — I10 Essential (primary) hypertension: Secondary | ICD-10-CM | POA: Diagnosis not present

## 2021-06-03 DIAGNOSIS — Z96641 Presence of right artificial hip joint: Secondary | ICD-10-CM | POA: Diagnosis not present

## 2021-06-03 DIAGNOSIS — F0390 Unspecified dementia without behavioral disturbance: Secondary | ICD-10-CM | POA: Diagnosis not present

## 2021-06-03 DIAGNOSIS — G9340 Encephalopathy, unspecified: Secondary | ICD-10-CM | POA: Diagnosis not present

## 2021-06-03 DIAGNOSIS — M810 Age-related osteoporosis without current pathological fracture: Secondary | ICD-10-CM | POA: Diagnosis not present

## 2021-06-17 DIAGNOSIS — R296 Repeated falls: Secondary | ICD-10-CM | POA: Diagnosis not present

## 2021-06-17 DIAGNOSIS — S42001D Fracture of unspecified part of right clavicle, subsequent encounter for fracture with routine healing: Secondary | ICD-10-CM | POA: Diagnosis not present

## 2021-06-17 DIAGNOSIS — M6281 Muscle weakness (generalized): Secondary | ICD-10-CM | POA: Diagnosis not present

## 2021-06-17 DIAGNOSIS — R2681 Unsteadiness on feet: Secondary | ICD-10-CM | POA: Diagnosis not present

## 2021-06-17 DIAGNOSIS — R262 Difficulty in walking, not elsewhere classified: Secondary | ICD-10-CM | POA: Diagnosis not present

## 2021-06-17 DIAGNOSIS — S42002D Fracture of unspecified part of left clavicle, subsequent encounter for fracture with routine healing: Secondary | ICD-10-CM | POA: Diagnosis not present

## 2021-06-17 DIAGNOSIS — Z9181 History of falling: Secondary | ICD-10-CM | POA: Diagnosis not present

## 2021-06-17 DIAGNOSIS — Z471 Aftercare following joint replacement surgery: Secondary | ICD-10-CM | POA: Diagnosis not present

## 2021-06-17 DIAGNOSIS — F0390 Unspecified dementia without behavioral disturbance: Secondary | ICD-10-CM | POA: Diagnosis not present

## 2021-06-17 DIAGNOSIS — Z741 Need for assistance with personal care: Secondary | ICD-10-CM | POA: Diagnosis not present

## 2021-06-17 DIAGNOSIS — G9340 Encephalopathy, unspecified: Secondary | ICD-10-CM | POA: Diagnosis not present

## 2021-06-18 DIAGNOSIS — S42031D Displaced fracture of lateral end of right clavicle, subsequent encounter for fracture with routine healing: Secondary | ICD-10-CM | POA: Diagnosis not present

## 2021-06-18 DIAGNOSIS — Z03818 Encounter for observation for suspected exposure to other biological agents ruled out: Secondary | ICD-10-CM | POA: Diagnosis not present

## 2021-06-18 DIAGNOSIS — R262 Difficulty in walking, not elsewhere classified: Secondary | ICD-10-CM | POA: Diagnosis not present

## 2021-06-18 DIAGNOSIS — R296 Repeated falls: Secondary | ICD-10-CM | POA: Diagnosis not present

## 2021-06-18 DIAGNOSIS — Z9181 History of falling: Secondary | ICD-10-CM | POA: Diagnosis not present

## 2021-06-18 DIAGNOSIS — S42022D Displaced fracture of shaft of left clavicle, subsequent encounter for fracture with routine healing: Secondary | ICD-10-CM | POA: Diagnosis not present

## 2021-06-18 DIAGNOSIS — R2681 Unsteadiness on feet: Secondary | ICD-10-CM | POA: Diagnosis not present

## 2021-06-18 DIAGNOSIS — Z741 Need for assistance with personal care: Secondary | ICD-10-CM | POA: Diagnosis not present

## 2021-06-18 DIAGNOSIS — M6281 Muscle weakness (generalized): Secondary | ICD-10-CM | POA: Diagnosis not present

## 2021-06-19 DIAGNOSIS — Z741 Need for assistance with personal care: Secondary | ICD-10-CM | POA: Diagnosis not present

## 2021-06-19 DIAGNOSIS — Z9181 History of falling: Secondary | ICD-10-CM | POA: Diagnosis not present

## 2021-06-19 DIAGNOSIS — R262 Difficulty in walking, not elsewhere classified: Secondary | ICD-10-CM | POA: Diagnosis not present

## 2021-06-19 DIAGNOSIS — M6281 Muscle weakness (generalized): Secondary | ICD-10-CM | POA: Diagnosis not present

## 2021-06-19 DIAGNOSIS — R2681 Unsteadiness on feet: Secondary | ICD-10-CM | POA: Diagnosis not present

## 2021-06-19 DIAGNOSIS — R296 Repeated falls: Secondary | ICD-10-CM | POA: Diagnosis not present

## 2021-06-20 DIAGNOSIS — Z9181 History of falling: Secondary | ICD-10-CM | POA: Diagnosis not present

## 2021-06-20 DIAGNOSIS — Z741 Need for assistance with personal care: Secondary | ICD-10-CM | POA: Diagnosis not present

## 2021-06-20 DIAGNOSIS — R2681 Unsteadiness on feet: Secondary | ICD-10-CM | POA: Diagnosis not present

## 2021-06-20 DIAGNOSIS — R262 Difficulty in walking, not elsewhere classified: Secondary | ICD-10-CM | POA: Diagnosis not present

## 2021-06-20 DIAGNOSIS — M6281 Muscle weakness (generalized): Secondary | ICD-10-CM | POA: Diagnosis not present

## 2021-06-20 DIAGNOSIS — R296 Repeated falls: Secondary | ICD-10-CM | POA: Diagnosis not present

## 2021-06-23 DIAGNOSIS — S42001D Fracture of unspecified part of right clavicle, subsequent encounter for fracture with routine healing: Secondary | ICD-10-CM | POA: Diagnosis not present

## 2021-06-23 DIAGNOSIS — Z741 Need for assistance with personal care: Secondary | ICD-10-CM | POA: Diagnosis not present

## 2021-06-23 DIAGNOSIS — Z471 Aftercare following joint replacement surgery: Secondary | ICD-10-CM | POA: Diagnosis not present

## 2021-06-23 DIAGNOSIS — R262 Difficulty in walking, not elsewhere classified: Secondary | ICD-10-CM | POA: Diagnosis not present

## 2021-06-23 DIAGNOSIS — R41841 Cognitive communication deficit: Secondary | ICD-10-CM | POA: Diagnosis not present

## 2021-06-23 DIAGNOSIS — R2681 Unsteadiness on feet: Secondary | ICD-10-CM | POA: Diagnosis not present

## 2021-06-23 DIAGNOSIS — M6281 Muscle weakness (generalized): Secondary | ICD-10-CM | POA: Diagnosis not present

## 2021-06-23 DIAGNOSIS — R296 Repeated falls: Secondary | ICD-10-CM | POA: Diagnosis not present

## 2021-06-23 DIAGNOSIS — Z03818 Encounter for observation for suspected exposure to other biological agents ruled out: Secondary | ICD-10-CM | POA: Diagnosis not present

## 2021-06-23 DIAGNOSIS — G9340 Encephalopathy, unspecified: Secondary | ICD-10-CM | POA: Diagnosis not present

## 2021-06-23 DIAGNOSIS — S42002D Fracture of unspecified part of left clavicle, subsequent encounter for fracture with routine healing: Secondary | ICD-10-CM | POA: Diagnosis not present

## 2021-06-23 DIAGNOSIS — F0390 Unspecified dementia without behavioral disturbance: Secondary | ICD-10-CM | POA: Diagnosis not present

## 2021-06-23 DIAGNOSIS — Z9181 History of falling: Secondary | ICD-10-CM | POA: Diagnosis not present

## 2021-06-24 DIAGNOSIS — F0390 Unspecified dementia without behavioral disturbance: Secondary | ICD-10-CM | POA: Diagnosis not present

## 2021-06-24 DIAGNOSIS — Z9181 History of falling: Secondary | ICD-10-CM | POA: Diagnosis not present

## 2021-06-24 DIAGNOSIS — R41841 Cognitive communication deficit: Secondary | ICD-10-CM | POA: Diagnosis not present

## 2021-06-24 DIAGNOSIS — Z741 Need for assistance with personal care: Secondary | ICD-10-CM | POA: Diagnosis not present

## 2021-06-24 DIAGNOSIS — M6281 Muscle weakness (generalized): Secondary | ICD-10-CM | POA: Diagnosis not present

## 2021-06-24 DIAGNOSIS — R2681 Unsteadiness on feet: Secondary | ICD-10-CM | POA: Diagnosis not present

## 2021-06-25 DIAGNOSIS — Z741 Need for assistance with personal care: Secondary | ICD-10-CM | POA: Diagnosis not present

## 2021-06-25 DIAGNOSIS — F0390 Unspecified dementia without behavioral disturbance: Secondary | ICD-10-CM | POA: Diagnosis not present

## 2021-06-25 DIAGNOSIS — M6281 Muscle weakness (generalized): Secondary | ICD-10-CM | POA: Diagnosis not present

## 2021-06-25 DIAGNOSIS — Z9181 History of falling: Secondary | ICD-10-CM | POA: Diagnosis not present

## 2021-06-25 DIAGNOSIS — R2681 Unsteadiness on feet: Secondary | ICD-10-CM | POA: Diagnosis not present

## 2021-06-25 DIAGNOSIS — R41841 Cognitive communication deficit: Secondary | ICD-10-CM | POA: Diagnosis not present

## 2021-06-26 DIAGNOSIS — F0390 Unspecified dementia without behavioral disturbance: Secondary | ICD-10-CM | POA: Diagnosis not present

## 2021-06-26 DIAGNOSIS — Z741 Need for assistance with personal care: Secondary | ICD-10-CM | POA: Diagnosis not present

## 2021-06-26 DIAGNOSIS — R2681 Unsteadiness on feet: Secondary | ICD-10-CM | POA: Diagnosis not present

## 2021-06-26 DIAGNOSIS — M6281 Muscle weakness (generalized): Secondary | ICD-10-CM | POA: Diagnosis not present

## 2021-06-26 DIAGNOSIS — R41841 Cognitive communication deficit: Secondary | ICD-10-CM | POA: Diagnosis not present

## 2021-06-26 DIAGNOSIS — Z9181 History of falling: Secondary | ICD-10-CM | POA: Diagnosis not present

## 2021-06-27 DIAGNOSIS — F0390 Unspecified dementia without behavioral disturbance: Secondary | ICD-10-CM | POA: Diagnosis not present

## 2021-06-27 DIAGNOSIS — Z9181 History of falling: Secondary | ICD-10-CM | POA: Diagnosis not present

## 2021-06-27 DIAGNOSIS — R2681 Unsteadiness on feet: Secondary | ICD-10-CM | POA: Diagnosis not present

## 2021-06-27 DIAGNOSIS — R41841 Cognitive communication deficit: Secondary | ICD-10-CM | POA: Diagnosis not present

## 2021-06-27 DIAGNOSIS — M6281 Muscle weakness (generalized): Secondary | ICD-10-CM | POA: Diagnosis not present

## 2021-06-27 DIAGNOSIS — Z741 Need for assistance with personal care: Secondary | ICD-10-CM | POA: Diagnosis not present

## 2021-06-30 DIAGNOSIS — Z741 Need for assistance with personal care: Secondary | ICD-10-CM | POA: Diagnosis not present

## 2021-06-30 DIAGNOSIS — Z9181 History of falling: Secondary | ICD-10-CM | POA: Diagnosis not present

## 2021-06-30 DIAGNOSIS — Z03818 Encounter for observation for suspected exposure to other biological agents ruled out: Secondary | ICD-10-CM | POA: Diagnosis not present

## 2021-06-30 DIAGNOSIS — R2681 Unsteadiness on feet: Secondary | ICD-10-CM | POA: Diagnosis not present

## 2021-06-30 DIAGNOSIS — F0390 Unspecified dementia without behavioral disturbance: Secondary | ICD-10-CM | POA: Diagnosis not present

## 2021-06-30 DIAGNOSIS — R41841 Cognitive communication deficit: Secondary | ICD-10-CM | POA: Diagnosis not present

## 2021-06-30 DIAGNOSIS — M6281 Muscle weakness (generalized): Secondary | ICD-10-CM | POA: Diagnosis not present

## 2021-07-01 DIAGNOSIS — Z741 Need for assistance with personal care: Secondary | ICD-10-CM | POA: Diagnosis not present

## 2021-07-01 DIAGNOSIS — M6281 Muscle weakness (generalized): Secondary | ICD-10-CM | POA: Diagnosis not present

## 2021-07-01 DIAGNOSIS — R2681 Unsteadiness on feet: Secondary | ICD-10-CM | POA: Diagnosis not present

## 2021-07-01 DIAGNOSIS — F0390 Unspecified dementia without behavioral disturbance: Secondary | ICD-10-CM | POA: Diagnosis not present

## 2021-07-01 DIAGNOSIS — R41841 Cognitive communication deficit: Secondary | ICD-10-CM | POA: Diagnosis not present

## 2021-07-01 DIAGNOSIS — Z9181 History of falling: Secondary | ICD-10-CM | POA: Diagnosis not present

## 2021-07-02 DIAGNOSIS — Z9181 History of falling: Secondary | ICD-10-CM | POA: Diagnosis not present

## 2021-07-02 DIAGNOSIS — F0390 Unspecified dementia without behavioral disturbance: Secondary | ICD-10-CM | POA: Diagnosis not present

## 2021-07-02 DIAGNOSIS — R41841 Cognitive communication deficit: Secondary | ICD-10-CM | POA: Diagnosis not present

## 2021-07-02 DIAGNOSIS — S42022D Displaced fracture of shaft of left clavicle, subsequent encounter for fracture with routine healing: Secondary | ICD-10-CM | POA: Diagnosis not present

## 2021-07-02 DIAGNOSIS — S42031D Displaced fracture of lateral end of right clavicle, subsequent encounter for fracture with routine healing: Secondary | ICD-10-CM | POA: Diagnosis not present

## 2021-07-02 DIAGNOSIS — Z741 Need for assistance with personal care: Secondary | ICD-10-CM | POA: Diagnosis not present

## 2021-07-02 DIAGNOSIS — M6281 Muscle weakness (generalized): Secondary | ICD-10-CM | POA: Diagnosis not present

## 2021-07-02 DIAGNOSIS — R2681 Unsteadiness on feet: Secondary | ICD-10-CM | POA: Diagnosis not present

## 2021-07-03 DIAGNOSIS — Z9181 History of falling: Secondary | ICD-10-CM | POA: Diagnosis not present

## 2021-07-03 DIAGNOSIS — R41841 Cognitive communication deficit: Secondary | ICD-10-CM | POA: Diagnosis not present

## 2021-07-03 DIAGNOSIS — Z741 Need for assistance with personal care: Secondary | ICD-10-CM | POA: Diagnosis not present

## 2021-07-03 DIAGNOSIS — F0390 Unspecified dementia without behavioral disturbance: Secondary | ICD-10-CM | POA: Diagnosis not present

## 2021-07-03 DIAGNOSIS — M6281 Muscle weakness (generalized): Secondary | ICD-10-CM | POA: Diagnosis not present

## 2021-07-03 DIAGNOSIS — R2681 Unsteadiness on feet: Secondary | ICD-10-CM | POA: Diagnosis not present

## 2021-07-05 DIAGNOSIS — R2681 Unsteadiness on feet: Secondary | ICD-10-CM | POA: Diagnosis not present

## 2021-07-05 DIAGNOSIS — F0390 Unspecified dementia without behavioral disturbance: Secondary | ICD-10-CM | POA: Diagnosis not present

## 2021-07-05 DIAGNOSIS — Z9181 History of falling: Secondary | ICD-10-CM | POA: Diagnosis not present

## 2021-07-05 DIAGNOSIS — R41841 Cognitive communication deficit: Secondary | ICD-10-CM | POA: Diagnosis not present

## 2021-07-05 DIAGNOSIS — M6281 Muscle weakness (generalized): Secondary | ICD-10-CM | POA: Diagnosis not present

## 2021-07-05 DIAGNOSIS — Z741 Need for assistance with personal care: Secondary | ICD-10-CM | POA: Diagnosis not present

## 2021-07-06 DIAGNOSIS — F0390 Unspecified dementia without behavioral disturbance: Secondary | ICD-10-CM | POA: Diagnosis not present

## 2021-07-06 DIAGNOSIS — Z741 Need for assistance with personal care: Secondary | ICD-10-CM | POA: Diagnosis not present

## 2021-07-06 DIAGNOSIS — R2681 Unsteadiness on feet: Secondary | ICD-10-CM | POA: Diagnosis not present

## 2021-07-06 DIAGNOSIS — M6281 Muscle weakness (generalized): Secondary | ICD-10-CM | POA: Diagnosis not present

## 2021-07-06 DIAGNOSIS — R41841 Cognitive communication deficit: Secondary | ICD-10-CM | POA: Diagnosis not present

## 2021-07-06 DIAGNOSIS — Z9181 History of falling: Secondary | ICD-10-CM | POA: Diagnosis not present

## 2021-07-07 DIAGNOSIS — Z9181 History of falling: Secondary | ICD-10-CM | POA: Diagnosis not present

## 2021-07-07 DIAGNOSIS — M6281 Muscle weakness (generalized): Secondary | ICD-10-CM | POA: Diagnosis not present

## 2021-07-07 DIAGNOSIS — Z741 Need for assistance with personal care: Secondary | ICD-10-CM | POA: Diagnosis not present

## 2021-07-07 DIAGNOSIS — R2681 Unsteadiness on feet: Secondary | ICD-10-CM | POA: Diagnosis not present

## 2021-07-07 DIAGNOSIS — F0390 Unspecified dementia without behavioral disturbance: Secondary | ICD-10-CM | POA: Diagnosis not present

## 2021-07-07 DIAGNOSIS — R41841 Cognitive communication deficit: Secondary | ICD-10-CM | POA: Diagnosis not present

## 2021-07-08 DIAGNOSIS — Z741 Need for assistance with personal care: Secondary | ICD-10-CM | POA: Diagnosis not present

## 2021-07-08 DIAGNOSIS — Z9181 History of falling: Secondary | ICD-10-CM | POA: Diagnosis not present

## 2021-07-08 DIAGNOSIS — F0390 Unspecified dementia without behavioral disturbance: Secondary | ICD-10-CM | POA: Diagnosis not present

## 2021-07-08 DIAGNOSIS — Z03818 Encounter for observation for suspected exposure to other biological agents ruled out: Secondary | ICD-10-CM | POA: Diagnosis not present

## 2021-07-08 DIAGNOSIS — R41841 Cognitive communication deficit: Secondary | ICD-10-CM | POA: Diagnosis not present

## 2021-07-08 DIAGNOSIS — R2681 Unsteadiness on feet: Secondary | ICD-10-CM | POA: Diagnosis not present

## 2021-07-08 DIAGNOSIS — M6281 Muscle weakness (generalized): Secondary | ICD-10-CM | POA: Diagnosis not present

## 2021-07-09 DIAGNOSIS — R41841 Cognitive communication deficit: Secondary | ICD-10-CM | POA: Diagnosis not present

## 2021-07-09 DIAGNOSIS — F0390 Unspecified dementia without behavioral disturbance: Secondary | ICD-10-CM | POA: Diagnosis not present

## 2021-07-09 DIAGNOSIS — Z741 Need for assistance with personal care: Secondary | ICD-10-CM | POA: Diagnosis not present

## 2021-07-09 DIAGNOSIS — Z9181 History of falling: Secondary | ICD-10-CM | POA: Diagnosis not present

## 2021-07-09 DIAGNOSIS — M6281 Muscle weakness (generalized): Secondary | ICD-10-CM | POA: Diagnosis not present

## 2021-07-09 DIAGNOSIS — R2681 Unsteadiness on feet: Secondary | ICD-10-CM | POA: Diagnosis not present

## 2021-07-10 DIAGNOSIS — Z741 Need for assistance with personal care: Secondary | ICD-10-CM | POA: Diagnosis not present

## 2021-07-10 DIAGNOSIS — Z9181 History of falling: Secondary | ICD-10-CM | POA: Diagnosis not present

## 2021-07-10 DIAGNOSIS — F0390 Unspecified dementia without behavioral disturbance: Secondary | ICD-10-CM | POA: Diagnosis not present

## 2021-07-10 DIAGNOSIS — R41841 Cognitive communication deficit: Secondary | ICD-10-CM | POA: Diagnosis not present

## 2021-07-10 DIAGNOSIS — R2681 Unsteadiness on feet: Secondary | ICD-10-CM | POA: Diagnosis not present

## 2021-07-10 DIAGNOSIS — M6281 Muscle weakness (generalized): Secondary | ICD-10-CM | POA: Diagnosis not present

## 2021-07-14 DIAGNOSIS — R41841 Cognitive communication deficit: Secondary | ICD-10-CM | POA: Diagnosis not present

## 2021-07-14 DIAGNOSIS — F0390 Unspecified dementia without behavioral disturbance: Secondary | ICD-10-CM | POA: Diagnosis not present

## 2021-07-14 DIAGNOSIS — I10 Essential (primary) hypertension: Secondary | ICD-10-CM | POA: Diagnosis not present

## 2021-07-14 DIAGNOSIS — R2681 Unsteadiness on feet: Secondary | ICD-10-CM | POA: Diagnosis not present

## 2021-07-14 DIAGNOSIS — G3184 Mild cognitive impairment, so stated: Secondary | ICD-10-CM | POA: Diagnosis not present

## 2021-07-14 DIAGNOSIS — M6281 Muscle weakness (generalized): Secondary | ICD-10-CM | POA: Diagnosis not present

## 2021-07-14 DIAGNOSIS — Z741 Need for assistance with personal care: Secondary | ICD-10-CM | POA: Diagnosis not present

## 2021-07-14 DIAGNOSIS — Z03818 Encounter for observation for suspected exposure to other biological agents ruled out: Secondary | ICD-10-CM | POA: Diagnosis not present

## 2021-07-14 DIAGNOSIS — Z9181 History of falling: Secondary | ICD-10-CM | POA: Diagnosis not present

## 2021-07-14 DIAGNOSIS — S4290XD Fracture of unspecified shoulder girdle, part unspecified, subsequent encounter for fracture with routine healing: Secondary | ICD-10-CM | POA: Diagnosis not present

## 2021-07-15 DIAGNOSIS — R2681 Unsteadiness on feet: Secondary | ICD-10-CM | POA: Diagnosis not present

## 2021-07-15 DIAGNOSIS — Z741 Need for assistance with personal care: Secondary | ICD-10-CM | POA: Diagnosis not present

## 2021-07-15 DIAGNOSIS — M6281 Muscle weakness (generalized): Secondary | ICD-10-CM | POA: Diagnosis not present

## 2021-07-15 DIAGNOSIS — Z9181 History of falling: Secondary | ICD-10-CM | POA: Diagnosis not present

## 2021-07-15 DIAGNOSIS — R41841 Cognitive communication deficit: Secondary | ICD-10-CM | POA: Diagnosis not present

## 2021-07-15 DIAGNOSIS — F0390 Unspecified dementia without behavioral disturbance: Secondary | ICD-10-CM | POA: Diagnosis not present

## 2021-07-16 DIAGNOSIS — Z9181 History of falling: Secondary | ICD-10-CM | POA: Diagnosis not present

## 2021-07-16 DIAGNOSIS — M6281 Muscle weakness (generalized): Secondary | ICD-10-CM | POA: Diagnosis not present

## 2021-07-16 DIAGNOSIS — F0390 Unspecified dementia without behavioral disturbance: Secondary | ICD-10-CM | POA: Diagnosis not present

## 2021-07-16 DIAGNOSIS — R2681 Unsteadiness on feet: Secondary | ICD-10-CM | POA: Diagnosis not present

## 2021-07-16 DIAGNOSIS — R41841 Cognitive communication deficit: Secondary | ICD-10-CM | POA: Diagnosis not present

## 2021-07-16 DIAGNOSIS — Z741 Need for assistance with personal care: Secondary | ICD-10-CM | POA: Diagnosis not present

## 2021-07-17 DIAGNOSIS — F0390 Unspecified dementia without behavioral disturbance: Secondary | ICD-10-CM | POA: Diagnosis not present

## 2021-07-17 DIAGNOSIS — Z9181 History of falling: Secondary | ICD-10-CM | POA: Diagnosis not present

## 2021-07-17 DIAGNOSIS — M6281 Muscle weakness (generalized): Secondary | ICD-10-CM | POA: Diagnosis not present

## 2021-07-17 DIAGNOSIS — R41841 Cognitive communication deficit: Secondary | ICD-10-CM | POA: Diagnosis not present

## 2021-07-17 DIAGNOSIS — R2681 Unsteadiness on feet: Secondary | ICD-10-CM | POA: Diagnosis not present

## 2021-07-17 DIAGNOSIS — Z741 Need for assistance with personal care: Secondary | ICD-10-CM | POA: Diagnosis not present

## 2021-07-18 DIAGNOSIS — M6281 Muscle weakness (generalized): Secondary | ICD-10-CM | POA: Diagnosis not present

## 2021-07-18 DIAGNOSIS — Z741 Need for assistance with personal care: Secondary | ICD-10-CM | POA: Diagnosis not present

## 2021-07-18 DIAGNOSIS — F0390 Unspecified dementia without behavioral disturbance: Secondary | ICD-10-CM | POA: Diagnosis not present

## 2021-07-18 DIAGNOSIS — R2681 Unsteadiness on feet: Secondary | ICD-10-CM | POA: Diagnosis not present

## 2021-07-18 DIAGNOSIS — R41841 Cognitive communication deficit: Secondary | ICD-10-CM | POA: Diagnosis not present

## 2021-07-18 DIAGNOSIS — Z9181 History of falling: Secondary | ICD-10-CM | POA: Diagnosis not present

## 2021-07-21 DIAGNOSIS — M6281 Muscle weakness (generalized): Secondary | ICD-10-CM | POA: Diagnosis not present

## 2021-07-21 DIAGNOSIS — Z03818 Encounter for observation for suspected exposure to other biological agents ruled out: Secondary | ICD-10-CM | POA: Diagnosis not present

## 2021-07-21 DIAGNOSIS — Z9181 History of falling: Secondary | ICD-10-CM | POA: Diagnosis not present

## 2021-07-21 DIAGNOSIS — R2681 Unsteadiness on feet: Secondary | ICD-10-CM | POA: Diagnosis not present

## 2021-07-21 DIAGNOSIS — Z741 Need for assistance with personal care: Secondary | ICD-10-CM | POA: Diagnosis not present

## 2021-07-21 DIAGNOSIS — R41841 Cognitive communication deficit: Secondary | ICD-10-CM | POA: Diagnosis not present

## 2021-07-21 DIAGNOSIS — F0390 Unspecified dementia without behavioral disturbance: Secondary | ICD-10-CM | POA: Diagnosis not present

## 2021-07-22 DIAGNOSIS — Z9181 History of falling: Secondary | ICD-10-CM | POA: Diagnosis not present

## 2021-07-22 DIAGNOSIS — R41841 Cognitive communication deficit: Secondary | ICD-10-CM | POA: Diagnosis not present

## 2021-07-22 DIAGNOSIS — Z741 Need for assistance with personal care: Secondary | ICD-10-CM | POA: Diagnosis not present

## 2021-07-22 DIAGNOSIS — M6281 Muscle weakness (generalized): Secondary | ICD-10-CM | POA: Diagnosis not present

## 2021-07-22 DIAGNOSIS — R2681 Unsteadiness on feet: Secondary | ICD-10-CM | POA: Diagnosis not present

## 2021-07-22 DIAGNOSIS — F0390 Unspecified dementia without behavioral disturbance: Secondary | ICD-10-CM | POA: Diagnosis not present

## 2021-07-23 DIAGNOSIS — R41841 Cognitive communication deficit: Secondary | ICD-10-CM | POA: Diagnosis not present

## 2021-07-23 DIAGNOSIS — Z9181 History of falling: Secondary | ICD-10-CM | POA: Diagnosis not present

## 2021-07-23 DIAGNOSIS — M6281 Muscle weakness (generalized): Secondary | ICD-10-CM | POA: Diagnosis not present

## 2021-07-23 DIAGNOSIS — F0390 Unspecified dementia without behavioral disturbance: Secondary | ICD-10-CM | POA: Diagnosis not present

## 2021-07-23 DIAGNOSIS — Z741 Need for assistance with personal care: Secondary | ICD-10-CM | POA: Diagnosis not present

## 2021-07-23 DIAGNOSIS — R2681 Unsteadiness on feet: Secondary | ICD-10-CM | POA: Diagnosis not present

## 2021-07-24 DIAGNOSIS — Z9181 History of falling: Secondary | ICD-10-CM | POA: Diagnosis not present

## 2021-07-24 DIAGNOSIS — R2681 Unsteadiness on feet: Secondary | ICD-10-CM | POA: Diagnosis not present

## 2021-07-24 DIAGNOSIS — S42001D Fracture of unspecified part of right clavicle, subsequent encounter for fracture with routine healing: Secondary | ICD-10-CM | POA: Diagnosis not present

## 2021-07-24 DIAGNOSIS — G9340 Encephalopathy, unspecified: Secondary | ICD-10-CM | POA: Diagnosis not present

## 2021-07-24 DIAGNOSIS — R262 Difficulty in walking, not elsewhere classified: Secondary | ICD-10-CM | POA: Diagnosis not present

## 2021-07-24 DIAGNOSIS — Z741 Need for assistance with personal care: Secondary | ICD-10-CM | POA: Diagnosis not present

## 2021-07-24 DIAGNOSIS — Z471 Aftercare following joint replacement surgery: Secondary | ICD-10-CM | POA: Diagnosis not present

## 2021-07-24 DIAGNOSIS — S42002D Fracture of unspecified part of left clavicle, subsequent encounter for fracture with routine healing: Secondary | ICD-10-CM | POA: Diagnosis not present

## 2021-07-24 DIAGNOSIS — F0390 Unspecified dementia without behavioral disturbance: Secondary | ICD-10-CM | POA: Diagnosis not present

## 2021-07-24 DIAGNOSIS — M6281 Muscle weakness (generalized): Secondary | ICD-10-CM | POA: Diagnosis not present

## 2021-07-24 DIAGNOSIS — R296 Repeated falls: Secondary | ICD-10-CM | POA: Diagnosis not present

## 2021-07-26 DIAGNOSIS — Z9181 History of falling: Secondary | ICD-10-CM | POA: Diagnosis not present

## 2021-07-26 DIAGNOSIS — Z741 Need for assistance with personal care: Secondary | ICD-10-CM | POA: Diagnosis not present

## 2021-07-26 DIAGNOSIS — R262 Difficulty in walking, not elsewhere classified: Secondary | ICD-10-CM | POA: Diagnosis not present

## 2021-07-26 DIAGNOSIS — M6281 Muscle weakness (generalized): Secondary | ICD-10-CM | POA: Diagnosis not present

## 2021-07-26 DIAGNOSIS — R2681 Unsteadiness on feet: Secondary | ICD-10-CM | POA: Diagnosis not present

## 2021-07-26 DIAGNOSIS — R296 Repeated falls: Secondary | ICD-10-CM | POA: Diagnosis not present

## 2021-07-28 DIAGNOSIS — R296 Repeated falls: Secondary | ICD-10-CM | POA: Diagnosis not present

## 2021-07-28 DIAGNOSIS — Z741 Need for assistance with personal care: Secondary | ICD-10-CM | POA: Diagnosis not present

## 2021-07-28 DIAGNOSIS — M6281 Muscle weakness (generalized): Secondary | ICD-10-CM | POA: Diagnosis not present

## 2021-07-28 DIAGNOSIS — R2681 Unsteadiness on feet: Secondary | ICD-10-CM | POA: Diagnosis not present

## 2021-07-28 DIAGNOSIS — R262 Difficulty in walking, not elsewhere classified: Secondary | ICD-10-CM | POA: Diagnosis not present

## 2021-07-28 DIAGNOSIS — Z9181 History of falling: Secondary | ICD-10-CM | POA: Diagnosis not present

## 2021-07-29 DIAGNOSIS — M6281 Muscle weakness (generalized): Secondary | ICD-10-CM | POA: Diagnosis not present

## 2021-07-29 DIAGNOSIS — Z9181 History of falling: Secondary | ICD-10-CM | POA: Diagnosis not present

## 2021-07-29 DIAGNOSIS — R262 Difficulty in walking, not elsewhere classified: Secondary | ICD-10-CM | POA: Diagnosis not present

## 2021-07-29 DIAGNOSIS — Z741 Need for assistance with personal care: Secondary | ICD-10-CM | POA: Diagnosis not present

## 2021-07-29 DIAGNOSIS — R2681 Unsteadiness on feet: Secondary | ICD-10-CM | POA: Diagnosis not present

## 2021-07-29 DIAGNOSIS — R296 Repeated falls: Secondary | ICD-10-CM | POA: Diagnosis not present

## 2021-07-30 DIAGNOSIS — Z03818 Encounter for observation for suspected exposure to other biological agents ruled out: Secondary | ICD-10-CM | POA: Diagnosis not present

## 2021-07-30 DIAGNOSIS — R296 Repeated falls: Secondary | ICD-10-CM | POA: Diagnosis not present

## 2021-07-30 DIAGNOSIS — Z9181 History of falling: Secondary | ICD-10-CM | POA: Diagnosis not present

## 2021-07-30 DIAGNOSIS — Z741 Need for assistance with personal care: Secondary | ICD-10-CM | POA: Diagnosis not present

## 2021-07-30 DIAGNOSIS — R2681 Unsteadiness on feet: Secondary | ICD-10-CM | POA: Diagnosis not present

## 2021-07-30 DIAGNOSIS — M6281 Muscle weakness (generalized): Secondary | ICD-10-CM | POA: Diagnosis not present

## 2021-07-30 DIAGNOSIS — R262 Difficulty in walking, not elsewhere classified: Secondary | ICD-10-CM | POA: Diagnosis not present

## 2021-07-31 DIAGNOSIS — R296 Repeated falls: Secondary | ICD-10-CM | POA: Diagnosis not present

## 2021-07-31 DIAGNOSIS — R2681 Unsteadiness on feet: Secondary | ICD-10-CM | POA: Diagnosis not present

## 2021-07-31 DIAGNOSIS — M6281 Muscle weakness (generalized): Secondary | ICD-10-CM | POA: Diagnosis not present

## 2021-07-31 DIAGNOSIS — R262 Difficulty in walking, not elsewhere classified: Secondary | ICD-10-CM | POA: Diagnosis not present

## 2021-07-31 DIAGNOSIS — Z9181 History of falling: Secondary | ICD-10-CM | POA: Diagnosis not present

## 2021-07-31 DIAGNOSIS — Z741 Need for assistance with personal care: Secondary | ICD-10-CM | POA: Diagnosis not present

## 2021-08-04 DIAGNOSIS — R296 Repeated falls: Secondary | ICD-10-CM | POA: Diagnosis not present

## 2021-08-04 DIAGNOSIS — Z03818 Encounter for observation for suspected exposure to other biological agents ruled out: Secondary | ICD-10-CM | POA: Diagnosis not present

## 2021-08-04 DIAGNOSIS — R262 Difficulty in walking, not elsewhere classified: Secondary | ICD-10-CM | POA: Diagnosis not present

## 2021-08-04 DIAGNOSIS — R2681 Unsteadiness on feet: Secondary | ICD-10-CM | POA: Diagnosis not present

## 2021-08-04 DIAGNOSIS — M6281 Muscle weakness (generalized): Secondary | ICD-10-CM | POA: Diagnosis not present

## 2021-08-04 DIAGNOSIS — Z741 Need for assistance with personal care: Secondary | ICD-10-CM | POA: Diagnosis not present

## 2021-08-04 DIAGNOSIS — Z9181 History of falling: Secondary | ICD-10-CM | POA: Diagnosis not present

## 2021-08-05 DIAGNOSIS — R296 Repeated falls: Secondary | ICD-10-CM | POA: Diagnosis not present

## 2021-08-05 DIAGNOSIS — R262 Difficulty in walking, not elsewhere classified: Secondary | ICD-10-CM | POA: Diagnosis not present

## 2021-08-05 DIAGNOSIS — M6281 Muscle weakness (generalized): Secondary | ICD-10-CM | POA: Diagnosis not present

## 2021-08-05 DIAGNOSIS — Z741 Need for assistance with personal care: Secondary | ICD-10-CM | POA: Diagnosis not present

## 2021-08-05 DIAGNOSIS — Z9181 History of falling: Secondary | ICD-10-CM | POA: Diagnosis not present

## 2021-08-05 DIAGNOSIS — R2681 Unsteadiness on feet: Secondary | ICD-10-CM | POA: Diagnosis not present

## 2021-08-06 DIAGNOSIS — Z741 Need for assistance with personal care: Secondary | ICD-10-CM | POA: Diagnosis not present

## 2021-08-06 DIAGNOSIS — M6281 Muscle weakness (generalized): Secondary | ICD-10-CM | POA: Diagnosis not present

## 2021-08-06 DIAGNOSIS — R296 Repeated falls: Secondary | ICD-10-CM | POA: Diagnosis not present

## 2021-08-06 DIAGNOSIS — R262 Difficulty in walking, not elsewhere classified: Secondary | ICD-10-CM | POA: Diagnosis not present

## 2021-08-06 DIAGNOSIS — Z9181 History of falling: Secondary | ICD-10-CM | POA: Diagnosis not present

## 2021-08-06 DIAGNOSIS — R2681 Unsteadiness on feet: Secondary | ICD-10-CM | POA: Diagnosis not present

## 2021-08-07 DIAGNOSIS — R262 Difficulty in walking, not elsewhere classified: Secondary | ICD-10-CM | POA: Diagnosis not present

## 2021-08-07 DIAGNOSIS — Z9181 History of falling: Secondary | ICD-10-CM | POA: Diagnosis not present

## 2021-08-07 DIAGNOSIS — R2681 Unsteadiness on feet: Secondary | ICD-10-CM | POA: Diagnosis not present

## 2021-08-07 DIAGNOSIS — M6281 Muscle weakness (generalized): Secondary | ICD-10-CM | POA: Diagnosis not present

## 2021-08-07 DIAGNOSIS — R296 Repeated falls: Secondary | ICD-10-CM | POA: Diagnosis not present

## 2021-08-07 DIAGNOSIS — Z741 Need for assistance with personal care: Secondary | ICD-10-CM | POA: Diagnosis not present

## 2021-08-08 IMAGING — DX DG FEMUR 2+V PORT*L*
4 series · 4 of 4 positions shown · non-contrast
Comparison: None.

CLINICAL DATA: Left femur pain. Pain from distal to proximal femur
laterally, patient reports pain prior to surgery. Recent hip
arthroplasty.

EXAM:
LEFT FEMUR PORTABLE 2 VIEWS

[femur ap proximal (1 of 2)]
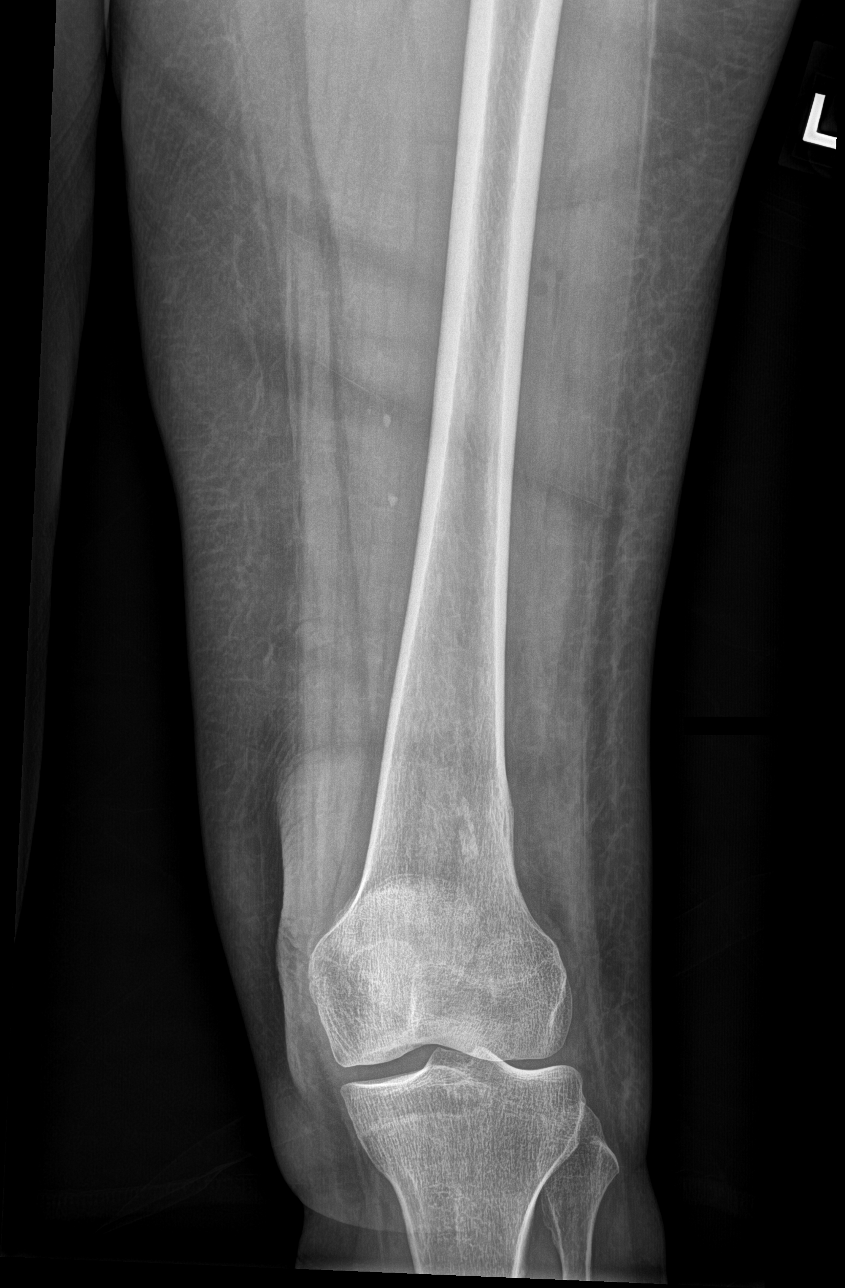

[femur ap proximal (2 of 2)]
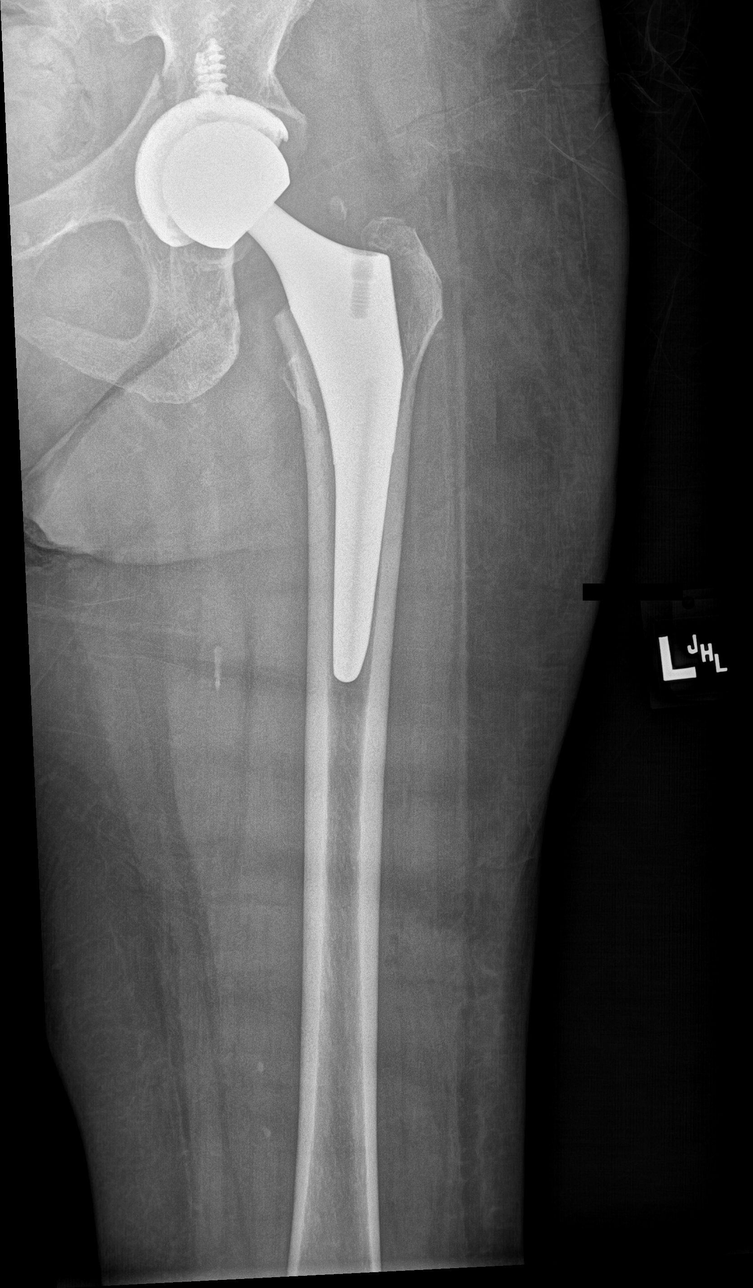

[femur lat (1 of 2)]
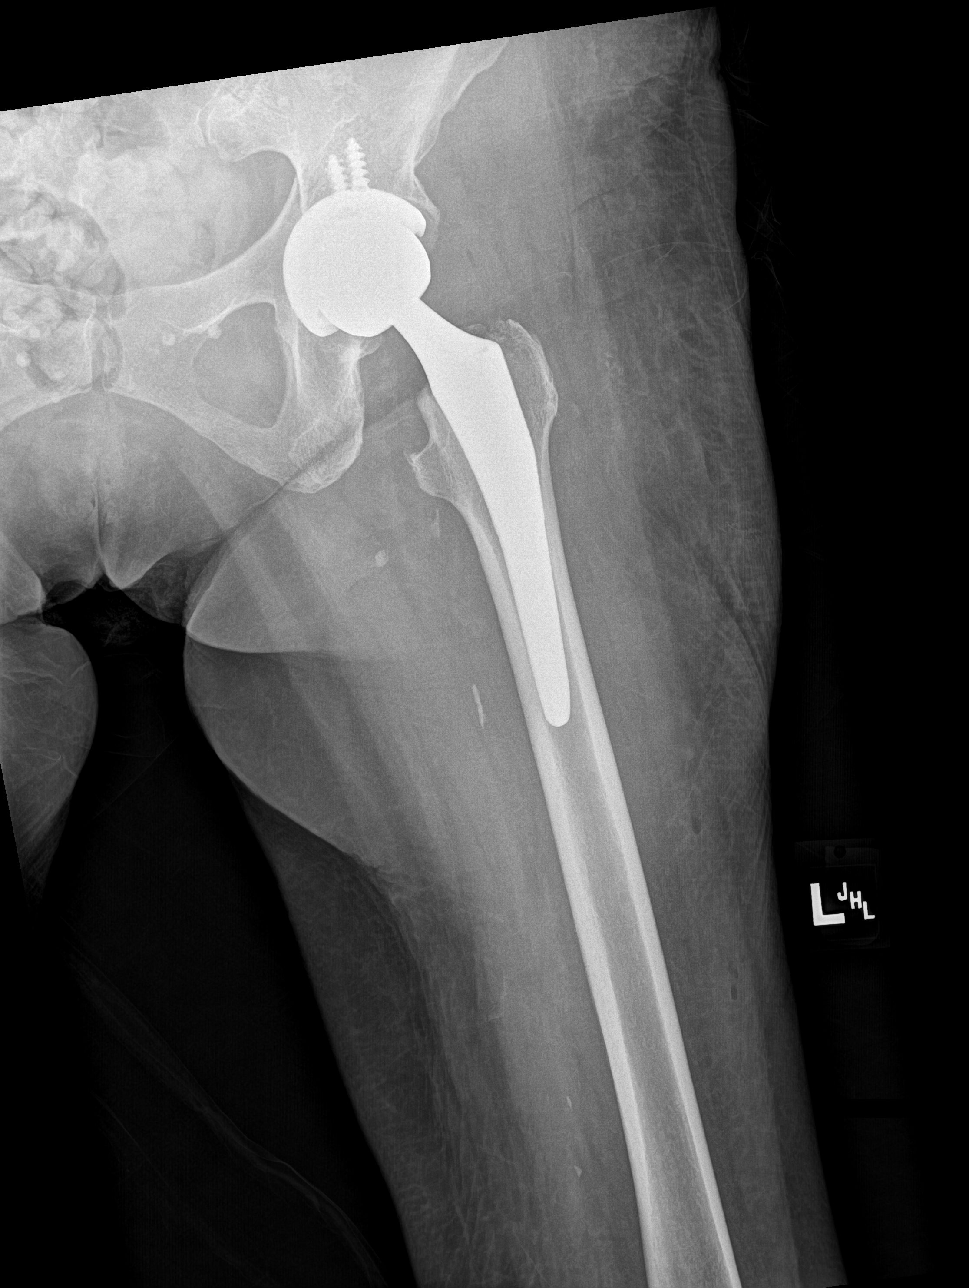

[femur lat (2 of 2)]
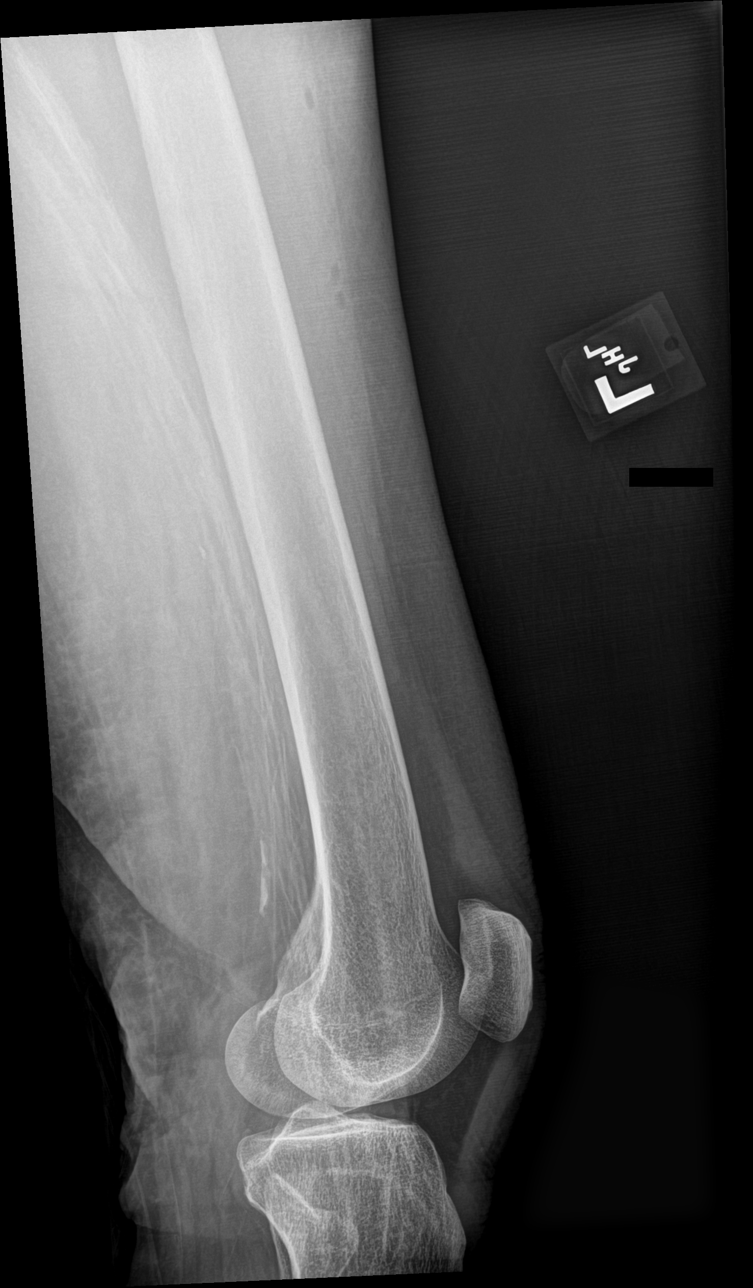

[4 of 4 positions shown; findings below may reference images not displayed]

FINDINGS: Intact left hip arthroplasty. No periprosthetic fracture. Slight
cortical thickening involving the lateral aspect of the distal
femoral metaphysis, nonspecific. Cortical margins otherwise intact.
No fracture or bony destruction. Knee alignment is maintained. Soft
tissue edema and occasional soft tissue air proximally related to
recent surgery. There are vascular calcifications.
IMPRESSION: 1. Intact left hip arthroplasty without periprosthetic fracture or
postoperative complications.
2. Slight cortical thickening involving the lateral aspect of the
distal femoral metaphysis is nonspecific. This may be an area of
stress change. Consider follow-up radiographs versus cross-sectional
imaging.

## 2021-08-09 IMAGING — DX DG CHEST 1V PORT
1 series · 1 of 1 positions shown · non-contrast
Comparison: Chest x-ray 08/21/2009.

CLINICAL DATA: Rehab screening.  History of hip fracture.

EXAM:
PORTABLE CHEST 1 VIEW

[chest ap]
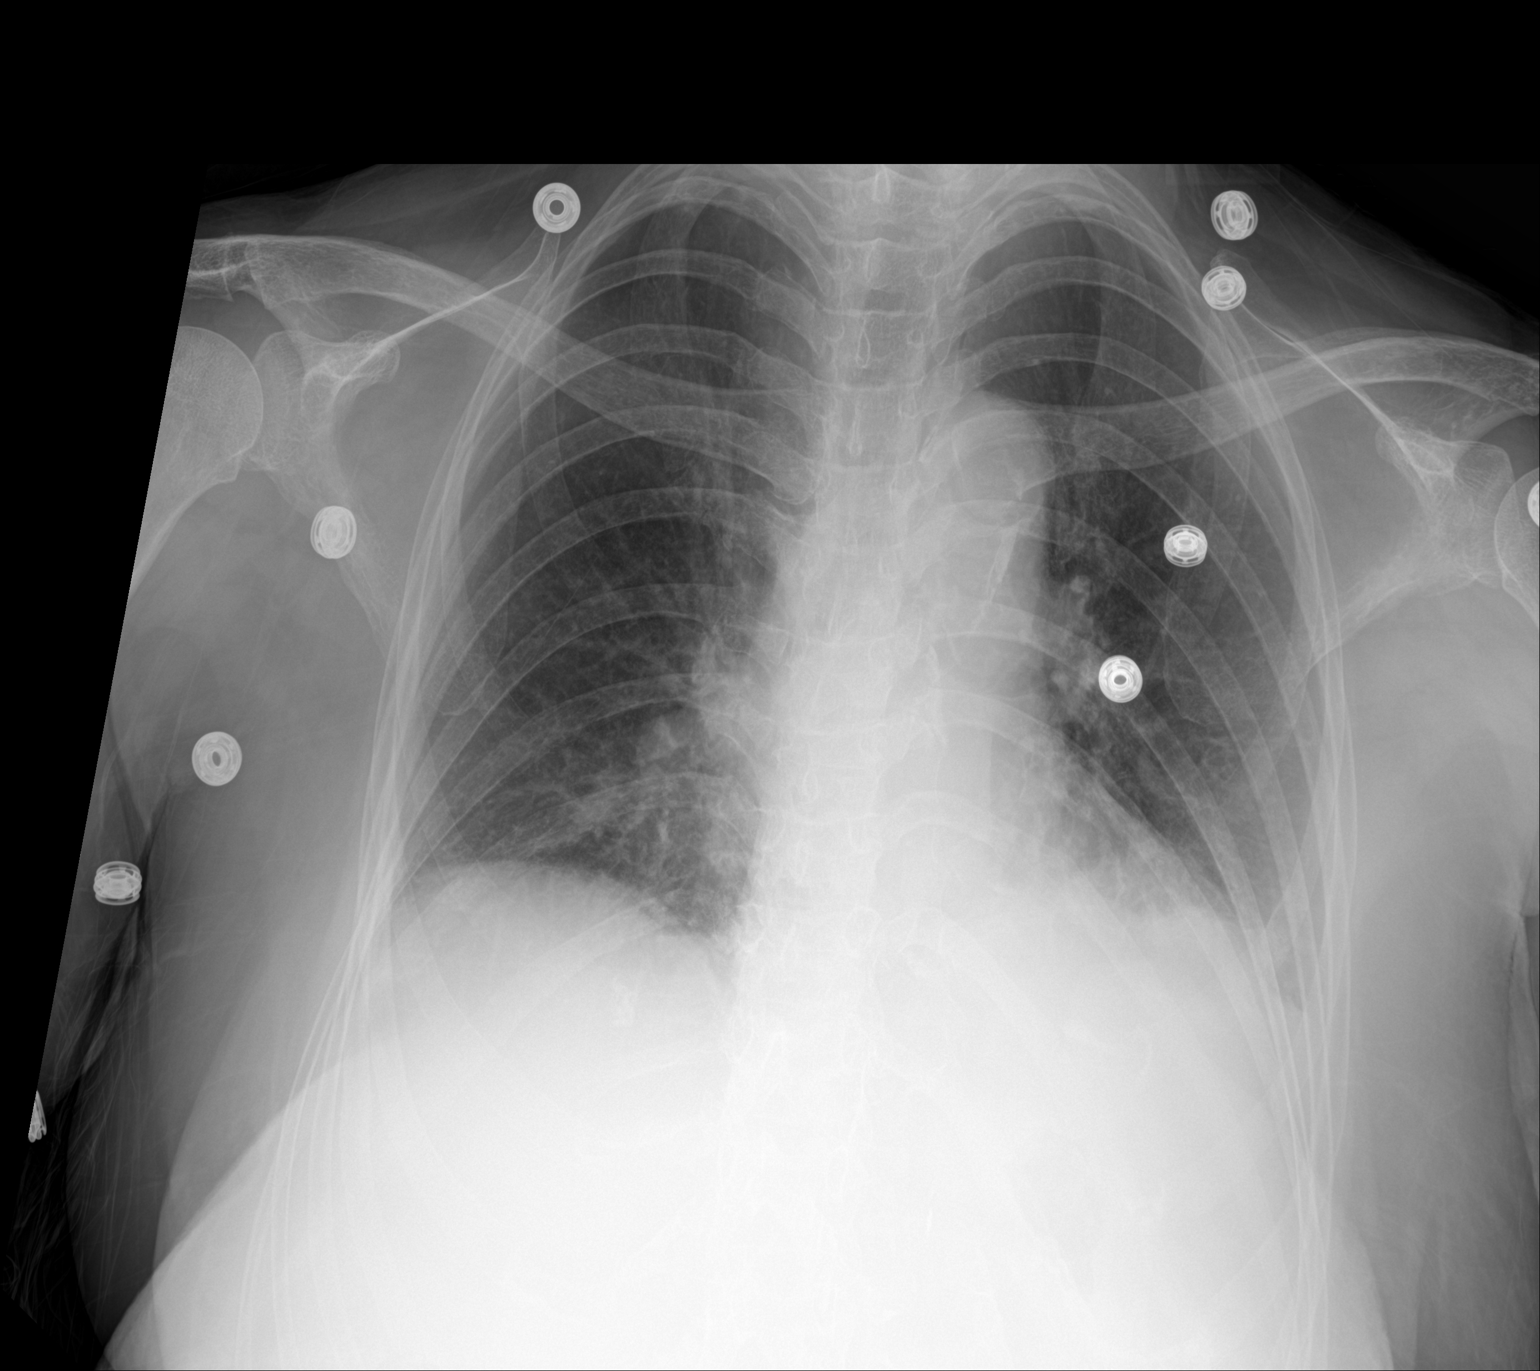

[1 of 1 positions shown; findings below may reference images not displayed]

FINDINGS: Mediastinum hilar structures normal. Low lung volumes with bibasilar
atelectasis/infiltrates. Small left pleural effusion. No
pneumothorax.
IMPRESSION: Low lung volumes with bibasilar atelectasis/infiltrates. Small left
pleural effusion.

## 2021-08-11 DIAGNOSIS — Z741 Need for assistance with personal care: Secondary | ICD-10-CM | POA: Diagnosis not present

## 2021-08-11 DIAGNOSIS — R262 Difficulty in walking, not elsewhere classified: Secondary | ICD-10-CM | POA: Diagnosis not present

## 2021-08-11 DIAGNOSIS — R296 Repeated falls: Secondary | ICD-10-CM | POA: Diagnosis not present

## 2021-08-11 DIAGNOSIS — R2681 Unsteadiness on feet: Secondary | ICD-10-CM | POA: Diagnosis not present

## 2021-08-11 DIAGNOSIS — M6281 Muscle weakness (generalized): Secondary | ICD-10-CM | POA: Diagnosis not present

## 2021-08-11 DIAGNOSIS — Z9181 History of falling: Secondary | ICD-10-CM | POA: Diagnosis not present

## 2021-08-11 DIAGNOSIS — Z03818 Encounter for observation for suspected exposure to other biological agents ruled out: Secondary | ICD-10-CM | POA: Diagnosis not present

## 2021-08-12 DIAGNOSIS — R296 Repeated falls: Secondary | ICD-10-CM | POA: Diagnosis not present

## 2021-08-12 DIAGNOSIS — Z9181 History of falling: Secondary | ICD-10-CM | POA: Diagnosis not present

## 2021-08-12 DIAGNOSIS — M6281 Muscle weakness (generalized): Secondary | ICD-10-CM | POA: Diagnosis not present

## 2021-08-12 DIAGNOSIS — R262 Difficulty in walking, not elsewhere classified: Secondary | ICD-10-CM | POA: Diagnosis not present

## 2021-08-12 DIAGNOSIS — R2681 Unsteadiness on feet: Secondary | ICD-10-CM | POA: Diagnosis not present

## 2021-08-12 DIAGNOSIS — Z741 Need for assistance with personal care: Secondary | ICD-10-CM | POA: Diagnosis not present

## 2021-08-13 DIAGNOSIS — Z9181 History of falling: Secondary | ICD-10-CM | POA: Diagnosis not present

## 2021-08-13 DIAGNOSIS — R296 Repeated falls: Secondary | ICD-10-CM | POA: Diagnosis not present

## 2021-08-13 DIAGNOSIS — R262 Difficulty in walking, not elsewhere classified: Secondary | ICD-10-CM | POA: Diagnosis not present

## 2021-08-13 DIAGNOSIS — R2681 Unsteadiness on feet: Secondary | ICD-10-CM | POA: Diagnosis not present

## 2021-08-13 DIAGNOSIS — Z741 Need for assistance with personal care: Secondary | ICD-10-CM | POA: Diagnosis not present

## 2021-08-13 DIAGNOSIS — M6281 Muscle weakness (generalized): Secondary | ICD-10-CM | POA: Diagnosis not present

## 2021-08-14 DIAGNOSIS — R296 Repeated falls: Secondary | ICD-10-CM | POA: Diagnosis not present

## 2021-08-14 DIAGNOSIS — M6281 Muscle weakness (generalized): Secondary | ICD-10-CM | POA: Diagnosis not present

## 2021-08-14 DIAGNOSIS — Z741 Need for assistance with personal care: Secondary | ICD-10-CM | POA: Diagnosis not present

## 2021-08-14 DIAGNOSIS — R2681 Unsteadiness on feet: Secondary | ICD-10-CM | POA: Diagnosis not present

## 2021-08-14 DIAGNOSIS — Z9181 History of falling: Secondary | ICD-10-CM | POA: Diagnosis not present

## 2021-08-14 DIAGNOSIS — R262 Difficulty in walking, not elsewhere classified: Secondary | ICD-10-CM | POA: Diagnosis not present

## 2021-08-18 DIAGNOSIS — R262 Difficulty in walking, not elsewhere classified: Secondary | ICD-10-CM | POA: Diagnosis not present

## 2021-08-18 DIAGNOSIS — Z9181 History of falling: Secondary | ICD-10-CM | POA: Diagnosis not present

## 2021-08-18 DIAGNOSIS — M6281 Muscle weakness (generalized): Secondary | ICD-10-CM | POA: Diagnosis not present

## 2021-08-18 DIAGNOSIS — R2681 Unsteadiness on feet: Secondary | ICD-10-CM | POA: Diagnosis not present

## 2021-08-18 DIAGNOSIS — Z741 Need for assistance with personal care: Secondary | ICD-10-CM | POA: Diagnosis not present

## 2021-08-18 DIAGNOSIS — R296 Repeated falls: Secondary | ICD-10-CM | POA: Diagnosis not present

## 2021-08-19 DIAGNOSIS — R2681 Unsteadiness on feet: Secondary | ICD-10-CM | POA: Diagnosis not present

## 2021-08-19 DIAGNOSIS — M6281 Muscle weakness (generalized): Secondary | ICD-10-CM | POA: Diagnosis not present

## 2021-08-19 DIAGNOSIS — R262 Difficulty in walking, not elsewhere classified: Secondary | ICD-10-CM | POA: Diagnosis not present

## 2021-08-19 DIAGNOSIS — Z741 Need for assistance with personal care: Secondary | ICD-10-CM | POA: Diagnosis not present

## 2021-08-19 DIAGNOSIS — R296 Repeated falls: Secondary | ICD-10-CM | POA: Diagnosis not present

## 2021-08-19 DIAGNOSIS — Z20822 Contact with and (suspected) exposure to covid-19: Secondary | ICD-10-CM | POA: Diagnosis not present

## 2021-08-19 DIAGNOSIS — Z9181 History of falling: Secondary | ICD-10-CM | POA: Diagnosis not present

## 2021-08-20 DIAGNOSIS — Z9181 History of falling: Secondary | ICD-10-CM | POA: Diagnosis not present

## 2021-08-20 DIAGNOSIS — R2681 Unsteadiness on feet: Secondary | ICD-10-CM | POA: Diagnosis not present

## 2021-08-20 DIAGNOSIS — Z741 Need for assistance with personal care: Secondary | ICD-10-CM | POA: Diagnosis not present

## 2021-08-20 DIAGNOSIS — M6281 Muscle weakness (generalized): Secondary | ICD-10-CM | POA: Diagnosis not present

## 2021-08-20 DIAGNOSIS — R262 Difficulty in walking, not elsewhere classified: Secondary | ICD-10-CM | POA: Diagnosis not present

## 2021-08-20 DIAGNOSIS — R296 Repeated falls: Secondary | ICD-10-CM | POA: Diagnosis not present

## 2021-08-21 DIAGNOSIS — R296 Repeated falls: Secondary | ICD-10-CM | POA: Diagnosis not present

## 2021-08-21 DIAGNOSIS — R2681 Unsteadiness on feet: Secondary | ICD-10-CM | POA: Diagnosis not present

## 2021-08-21 DIAGNOSIS — Z741 Need for assistance with personal care: Secondary | ICD-10-CM | POA: Diagnosis not present

## 2021-08-21 DIAGNOSIS — R262 Difficulty in walking, not elsewhere classified: Secondary | ICD-10-CM | POA: Diagnosis not present

## 2021-08-21 DIAGNOSIS — M6281 Muscle weakness (generalized): Secondary | ICD-10-CM | POA: Diagnosis not present

## 2021-08-21 DIAGNOSIS — Z9181 History of falling: Secondary | ICD-10-CM | POA: Diagnosis not present

## 2021-08-25 DIAGNOSIS — M6281 Muscle weakness (generalized): Secondary | ICD-10-CM | POA: Diagnosis not present

## 2021-08-25 DIAGNOSIS — R262 Difficulty in walking, not elsewhere classified: Secondary | ICD-10-CM | POA: Diagnosis not present

## 2021-08-25 DIAGNOSIS — R2681 Unsteadiness on feet: Secondary | ICD-10-CM | POA: Diagnosis not present

## 2021-08-25 DIAGNOSIS — Z471 Aftercare following joint replacement surgery: Secondary | ICD-10-CM | POA: Diagnosis not present

## 2021-08-25 DIAGNOSIS — R410 Disorientation, unspecified: Secondary | ICD-10-CM | POA: Diagnosis not present

## 2021-08-25 DIAGNOSIS — S42001D Fracture of unspecified part of right clavicle, subsequent encounter for fracture with routine healing: Secondary | ICD-10-CM | POA: Diagnosis not present

## 2021-08-25 DIAGNOSIS — S42002D Fracture of unspecified part of left clavicle, subsequent encounter for fracture with routine healing: Secondary | ICD-10-CM | POA: Diagnosis not present

## 2021-08-25 DIAGNOSIS — Z9181 History of falling: Secondary | ICD-10-CM | POA: Diagnosis not present

## 2021-08-26 DIAGNOSIS — S42001D Fracture of unspecified part of right clavicle, subsequent encounter for fracture with routine healing: Secondary | ICD-10-CM | POA: Diagnosis not present

## 2021-08-26 DIAGNOSIS — S42002D Fracture of unspecified part of left clavicle, subsequent encounter for fracture with routine healing: Secondary | ICD-10-CM | POA: Diagnosis not present

## 2021-08-26 DIAGNOSIS — M6281 Muscle weakness (generalized): Secondary | ICD-10-CM | POA: Diagnosis not present

## 2021-08-26 DIAGNOSIS — Z9181 History of falling: Secondary | ICD-10-CM | POA: Diagnosis not present

## 2021-08-26 DIAGNOSIS — R262 Difficulty in walking, not elsewhere classified: Secondary | ICD-10-CM | POA: Diagnosis not present

## 2021-08-26 DIAGNOSIS — R2681 Unsteadiness on feet: Secondary | ICD-10-CM | POA: Diagnosis not present

## 2021-08-27 DIAGNOSIS — Z20822 Contact with and (suspected) exposure to covid-19: Secondary | ICD-10-CM | POA: Diagnosis not present

## 2021-08-27 DIAGNOSIS — S42002D Fracture of unspecified part of left clavicle, subsequent encounter for fracture with routine healing: Secondary | ICD-10-CM | POA: Diagnosis not present

## 2021-08-27 DIAGNOSIS — M6281 Muscle weakness (generalized): Secondary | ICD-10-CM | POA: Diagnosis not present

## 2021-08-27 DIAGNOSIS — R262 Difficulty in walking, not elsewhere classified: Secondary | ICD-10-CM | POA: Diagnosis not present

## 2021-08-27 DIAGNOSIS — S42001D Fracture of unspecified part of right clavicle, subsequent encounter for fracture with routine healing: Secondary | ICD-10-CM | POA: Diagnosis not present

## 2021-08-27 DIAGNOSIS — R2681 Unsteadiness on feet: Secondary | ICD-10-CM | POA: Diagnosis not present

## 2021-08-27 DIAGNOSIS — Z9181 History of falling: Secondary | ICD-10-CM | POA: Diagnosis not present

## 2021-08-28 DIAGNOSIS — S42001D Fracture of unspecified part of right clavicle, subsequent encounter for fracture with routine healing: Secondary | ICD-10-CM | POA: Diagnosis not present

## 2021-08-28 DIAGNOSIS — M6281 Muscle weakness (generalized): Secondary | ICD-10-CM | POA: Diagnosis not present

## 2021-08-28 DIAGNOSIS — R262 Difficulty in walking, not elsewhere classified: Secondary | ICD-10-CM | POA: Diagnosis not present

## 2021-08-28 DIAGNOSIS — Z9181 History of falling: Secondary | ICD-10-CM | POA: Diagnosis not present

## 2021-08-28 DIAGNOSIS — S42002D Fracture of unspecified part of left clavicle, subsequent encounter for fracture with routine healing: Secondary | ICD-10-CM | POA: Diagnosis not present

## 2021-08-28 DIAGNOSIS — R2681 Unsteadiness on feet: Secondary | ICD-10-CM | POA: Diagnosis not present

## 2021-08-29 DIAGNOSIS — I10 Essential (primary) hypertension: Secondary | ICD-10-CM | POA: Diagnosis not present

## 2021-08-29 DIAGNOSIS — D649 Anemia, unspecified: Secondary | ICD-10-CM | POA: Diagnosis not present

## 2021-08-30 DIAGNOSIS — M47816 Spondylosis without myelopathy or radiculopathy, lumbar region: Secondary | ICD-10-CM | POA: Diagnosis not present

## 2021-09-01 DIAGNOSIS — R262 Difficulty in walking, not elsewhere classified: Secondary | ICD-10-CM | POA: Diagnosis not present

## 2021-09-01 DIAGNOSIS — M6281 Muscle weakness (generalized): Secondary | ICD-10-CM | POA: Diagnosis not present

## 2021-09-01 DIAGNOSIS — S42001D Fracture of unspecified part of right clavicle, subsequent encounter for fracture with routine healing: Secondary | ICD-10-CM | POA: Diagnosis not present

## 2021-09-01 DIAGNOSIS — Z9181 History of falling: Secondary | ICD-10-CM | POA: Diagnosis not present

## 2021-09-01 DIAGNOSIS — R2681 Unsteadiness on feet: Secondary | ICD-10-CM | POA: Diagnosis not present

## 2021-09-01 DIAGNOSIS — S42002D Fracture of unspecified part of left clavicle, subsequent encounter for fracture with routine healing: Secondary | ICD-10-CM | POA: Diagnosis not present

## 2021-09-02 DIAGNOSIS — M6281 Muscle weakness (generalized): Secondary | ICD-10-CM | POA: Diagnosis not present

## 2021-09-02 DIAGNOSIS — Z9181 History of falling: Secondary | ICD-10-CM | POA: Diagnosis not present

## 2021-09-02 DIAGNOSIS — S42002D Fracture of unspecified part of left clavicle, subsequent encounter for fracture with routine healing: Secondary | ICD-10-CM | POA: Diagnosis not present

## 2021-09-02 DIAGNOSIS — R2681 Unsteadiness on feet: Secondary | ICD-10-CM | POA: Diagnosis not present

## 2021-09-02 DIAGNOSIS — S42001D Fracture of unspecified part of right clavicle, subsequent encounter for fracture with routine healing: Secondary | ICD-10-CM | POA: Diagnosis not present

## 2021-09-02 DIAGNOSIS — R262 Difficulty in walking, not elsewhere classified: Secondary | ICD-10-CM | POA: Diagnosis not present

## 2021-09-05 DIAGNOSIS — M79674 Pain in right toe(s): Secondary | ICD-10-CM | POA: Diagnosis not present

## 2021-09-05 DIAGNOSIS — M2012 Hallux valgus (acquired), left foot: Secondary | ICD-10-CM | POA: Diagnosis not present

## 2021-09-05 DIAGNOSIS — B351 Tinea unguium: Secondary | ICD-10-CM | POA: Diagnosis not present

## 2021-09-05 DIAGNOSIS — M2011 Hallux valgus (acquired), right foot: Secondary | ICD-10-CM | POA: Diagnosis not present

## 2021-09-05 DIAGNOSIS — M79675 Pain in left toe(s): Secondary | ICD-10-CM | POA: Diagnosis not present

## 2021-09-05 DIAGNOSIS — Z20822 Contact with and (suspected) exposure to covid-19: Secondary | ICD-10-CM | POA: Diagnosis not present

## 2021-09-05 DIAGNOSIS — Z7901 Long term (current) use of anticoagulants: Secondary | ICD-10-CM | POA: Diagnosis not present

## 2021-09-05 DIAGNOSIS — R0989 Other specified symptoms and signs involving the circulatory and respiratory systems: Secondary | ICD-10-CM | POA: Diagnosis not present

## 2021-09-05 DIAGNOSIS — R600 Localized edema: Secondary | ICD-10-CM | POA: Diagnosis not present

## 2021-09-10 DIAGNOSIS — Z20822 Contact with and (suspected) exposure to covid-19: Secondary | ICD-10-CM | POA: Diagnosis not present

## 2021-09-11 DIAGNOSIS — Z23 Encounter for immunization: Secondary | ICD-10-CM | POA: Diagnosis not present

## 2021-09-29 DIAGNOSIS — Z20828 Contact with and (suspected) exposure to other viral communicable diseases: Secondary | ICD-10-CM | POA: Diagnosis not present

## 2021-09-29 DIAGNOSIS — J00 Acute nasopharyngitis [common cold]: Secondary | ICD-10-CM | POA: Diagnosis not present

## 2021-10-03 DIAGNOSIS — Z20828 Contact with and (suspected) exposure to other viral communicable diseases: Secondary | ICD-10-CM | POA: Diagnosis not present

## 2021-10-07 DIAGNOSIS — R300 Dysuria: Secondary | ICD-10-CM | POA: Diagnosis not present

## 2021-10-07 DIAGNOSIS — R410 Disorientation, unspecified: Secondary | ICD-10-CM | POA: Diagnosis not present

## 2021-10-08 DIAGNOSIS — Z20828 Contact with and (suspected) exposure to other viral communicable diseases: Secondary | ICD-10-CM | POA: Diagnosis not present

## 2021-10-20 DIAGNOSIS — Z20828 Contact with and (suspected) exposure to other viral communicable diseases: Secondary | ICD-10-CM | POA: Diagnosis not present

## 2021-10-23 DIAGNOSIS — Z20828 Contact with and (suspected) exposure to other viral communicable diseases: Secondary | ICD-10-CM | POA: Diagnosis not present

## 2021-10-26 DIAGNOSIS — Z20828 Contact with and (suspected) exposure to other viral communicable diseases: Secondary | ICD-10-CM | POA: Diagnosis not present

## 2021-10-30 DIAGNOSIS — Z1383 Encounter for screening for respiratory disorder NEC: Secondary | ICD-10-CM | POA: Diagnosis not present

## 2021-10-30 DIAGNOSIS — Z20828 Contact with and (suspected) exposure to other viral communicable diseases: Secondary | ICD-10-CM | POA: Diagnosis not present

## 2021-11-06 DIAGNOSIS — Z20822 Contact with and (suspected) exposure to covid-19: Secondary | ICD-10-CM | POA: Diagnosis not present

## 2021-11-06 DIAGNOSIS — Z1383 Encounter for screening for respiratory disorder NEC: Secondary | ICD-10-CM | POA: Diagnosis not present

## 2021-11-17 DIAGNOSIS — R293 Abnormal posture: Secondary | ICD-10-CM | POA: Diagnosis not present

## 2021-11-17 DIAGNOSIS — M1380 Other specified arthritis, unspecified site: Secondary | ICD-10-CM | POA: Diagnosis not present

## 2021-11-17 DIAGNOSIS — R262 Difficulty in walking, not elsewhere classified: Secondary | ICD-10-CM | POA: Diagnosis not present

## 2021-11-17 DIAGNOSIS — M6281 Muscle weakness (generalized): Secondary | ICD-10-CM | POA: Diagnosis not present

## 2021-11-17 DIAGNOSIS — Z471 Aftercare following joint replacement surgery: Secondary | ICD-10-CM | POA: Diagnosis not present

## 2021-11-17 DIAGNOSIS — G3184 Mild cognitive impairment, so stated: Secondary | ICD-10-CM | POA: Diagnosis not present

## 2021-11-17 DIAGNOSIS — R2681 Unsteadiness on feet: Secondary | ICD-10-CM | POA: Diagnosis not present

## 2021-11-18 DIAGNOSIS — R2681 Unsteadiness on feet: Secondary | ICD-10-CM | POA: Diagnosis not present

## 2021-11-18 DIAGNOSIS — M1380 Other specified arthritis, unspecified site: Secondary | ICD-10-CM | POA: Diagnosis not present

## 2021-11-18 DIAGNOSIS — R262 Difficulty in walking, not elsewhere classified: Secondary | ICD-10-CM | POA: Diagnosis not present

## 2021-11-18 DIAGNOSIS — R293 Abnormal posture: Secondary | ICD-10-CM | POA: Diagnosis not present

## 2021-11-18 DIAGNOSIS — G3184 Mild cognitive impairment, so stated: Secondary | ICD-10-CM | POA: Diagnosis not present

## 2021-11-18 DIAGNOSIS — M6281 Muscle weakness (generalized): Secondary | ICD-10-CM | POA: Diagnosis not present

## 2021-11-19 DIAGNOSIS — R2681 Unsteadiness on feet: Secondary | ICD-10-CM | POA: Diagnosis not present

## 2021-11-19 DIAGNOSIS — M6281 Muscle weakness (generalized): Secondary | ICD-10-CM | POA: Diagnosis not present

## 2021-11-19 DIAGNOSIS — Z20822 Contact with and (suspected) exposure to covid-19: Secondary | ICD-10-CM | POA: Diagnosis not present

## 2021-11-19 DIAGNOSIS — G3184 Mild cognitive impairment, so stated: Secondary | ICD-10-CM | POA: Diagnosis not present

## 2021-11-19 DIAGNOSIS — R293 Abnormal posture: Secondary | ICD-10-CM | POA: Diagnosis not present

## 2021-11-19 DIAGNOSIS — M1380 Other specified arthritis, unspecified site: Secondary | ICD-10-CM | POA: Diagnosis not present

## 2021-11-19 DIAGNOSIS — R262 Difficulty in walking, not elsewhere classified: Secondary | ICD-10-CM | POA: Diagnosis not present

## 2021-11-24 DIAGNOSIS — Z20822 Contact with and (suspected) exposure to covid-19: Secondary | ICD-10-CM | POA: Diagnosis not present

## 2021-11-26 DIAGNOSIS — R2681 Unsteadiness on feet: Secondary | ICD-10-CM | POA: Diagnosis not present

## 2021-11-26 DIAGNOSIS — Z471 Aftercare following joint replacement surgery: Secondary | ICD-10-CM | POA: Diagnosis not present

## 2021-11-26 DIAGNOSIS — M6281 Muscle weakness (generalized): Secondary | ICD-10-CM | POA: Diagnosis not present

## 2021-11-26 DIAGNOSIS — R293 Abnormal posture: Secondary | ICD-10-CM | POA: Diagnosis not present

## 2021-11-26 DIAGNOSIS — R262 Difficulty in walking, not elsewhere classified: Secondary | ICD-10-CM | POA: Diagnosis not present

## 2021-11-26 DIAGNOSIS — G3184 Mild cognitive impairment, so stated: Secondary | ICD-10-CM | POA: Diagnosis not present

## 2021-11-26 DIAGNOSIS — M1380 Other specified arthritis, unspecified site: Secondary | ICD-10-CM | POA: Diagnosis not present

## 2021-11-28 DIAGNOSIS — R2681 Unsteadiness on feet: Secondary | ICD-10-CM | POA: Diagnosis not present

## 2021-11-28 DIAGNOSIS — S0191XA Laceration without foreign body of unspecified part of head, initial encounter: Secondary | ICD-10-CM | POA: Diagnosis not present

## 2021-11-28 DIAGNOSIS — M1380 Other specified arthritis, unspecified site: Secondary | ICD-10-CM | POA: Diagnosis not present

## 2021-11-28 DIAGNOSIS — F17210 Nicotine dependence, cigarettes, uncomplicated: Secondary | ICD-10-CM | POA: Diagnosis not present

## 2021-11-28 DIAGNOSIS — M2550 Pain in unspecified joint: Secondary | ICD-10-CM | POA: Diagnosis not present

## 2021-11-28 DIAGNOSIS — Y929 Unspecified place or not applicable: Secondary | ICD-10-CM | POA: Diagnosis not present

## 2021-11-28 DIAGNOSIS — G3184 Mild cognitive impairment, so stated: Secondary | ICD-10-CM | POA: Diagnosis not present

## 2021-11-28 DIAGNOSIS — S0003XA Contusion of scalp, initial encounter: Secondary | ICD-10-CM | POA: Diagnosis not present

## 2021-11-28 DIAGNOSIS — S0990XA Unspecified injury of head, initial encounter: Secondary | ICD-10-CM | POA: Diagnosis not present

## 2021-11-28 DIAGNOSIS — W19XXXA Unspecified fall, initial encounter: Secondary | ICD-10-CM | POA: Diagnosis not present

## 2021-11-28 DIAGNOSIS — Y939 Activity, unspecified: Secondary | ICD-10-CM | POA: Diagnosis not present

## 2021-11-28 DIAGNOSIS — I10 Essential (primary) hypertension: Secondary | ICD-10-CM | POA: Diagnosis not present

## 2021-11-28 DIAGNOSIS — M6281 Muscle weakness (generalized): Secondary | ICD-10-CM | POA: Diagnosis not present

## 2021-11-28 DIAGNOSIS — R293 Abnormal posture: Secondary | ICD-10-CM | POA: Diagnosis not present

## 2021-11-28 DIAGNOSIS — Z20822 Contact with and (suspected) exposure to covid-19: Secondary | ICD-10-CM | POA: Diagnosis not present

## 2021-11-28 DIAGNOSIS — R262 Difficulty in walking, not elsewhere classified: Secondary | ICD-10-CM | POA: Diagnosis not present

## 2021-11-28 DIAGNOSIS — W0110XA Fall on same level from slipping, tripping and stumbling with subsequent striking against unspecified object, initial encounter: Secondary | ICD-10-CM | POA: Diagnosis not present

## 2021-12-01 DIAGNOSIS — Z20822 Contact with and (suspected) exposure to covid-19: Secondary | ICD-10-CM | POA: Diagnosis not present

## 2021-12-02 DIAGNOSIS — R262 Difficulty in walking, not elsewhere classified: Secondary | ICD-10-CM | POA: Diagnosis not present

## 2021-12-02 DIAGNOSIS — R2681 Unsteadiness on feet: Secondary | ICD-10-CM | POA: Diagnosis not present

## 2021-12-02 DIAGNOSIS — R293 Abnormal posture: Secondary | ICD-10-CM | POA: Diagnosis not present

## 2021-12-02 DIAGNOSIS — M6281 Muscle weakness (generalized): Secondary | ICD-10-CM | POA: Diagnosis not present

## 2021-12-02 DIAGNOSIS — G3184 Mild cognitive impairment, so stated: Secondary | ICD-10-CM | POA: Diagnosis not present

## 2021-12-02 DIAGNOSIS — M1380 Other specified arthritis, unspecified site: Secondary | ICD-10-CM | POA: Diagnosis not present

## 2021-12-03 DIAGNOSIS — M6281 Muscle weakness (generalized): Secondary | ICD-10-CM | POA: Diagnosis not present

## 2021-12-03 DIAGNOSIS — Z20822 Contact with and (suspected) exposure to covid-19: Secondary | ICD-10-CM | POA: Diagnosis not present

## 2021-12-03 DIAGNOSIS — R2681 Unsteadiness on feet: Secondary | ICD-10-CM | POA: Diagnosis not present

## 2021-12-03 DIAGNOSIS — Z03818 Encounter for observation for suspected exposure to other biological agents ruled out: Secondary | ICD-10-CM | POA: Diagnosis not present

## 2021-12-03 DIAGNOSIS — M1380 Other specified arthritis, unspecified site: Secondary | ICD-10-CM | POA: Diagnosis not present

## 2021-12-03 DIAGNOSIS — G3184 Mild cognitive impairment, so stated: Secondary | ICD-10-CM | POA: Diagnosis not present

## 2021-12-03 DIAGNOSIS — R293 Abnormal posture: Secondary | ICD-10-CM | POA: Diagnosis not present

## 2021-12-03 DIAGNOSIS — R262 Difficulty in walking, not elsewhere classified: Secondary | ICD-10-CM | POA: Diagnosis not present

## 2021-12-05 DIAGNOSIS — G3184 Mild cognitive impairment, so stated: Secondary | ICD-10-CM | POA: Diagnosis not present

## 2021-12-05 DIAGNOSIS — R293 Abnormal posture: Secondary | ICD-10-CM | POA: Diagnosis not present

## 2021-12-05 DIAGNOSIS — M6281 Muscle weakness (generalized): Secondary | ICD-10-CM | POA: Diagnosis not present

## 2021-12-05 DIAGNOSIS — R2681 Unsteadiness on feet: Secondary | ICD-10-CM | POA: Diagnosis not present

## 2021-12-05 DIAGNOSIS — Z20822 Contact with and (suspected) exposure to covid-19: Secondary | ICD-10-CM | POA: Diagnosis not present

## 2021-12-05 DIAGNOSIS — M1380 Other specified arthritis, unspecified site: Secondary | ICD-10-CM | POA: Diagnosis not present

## 2021-12-05 DIAGNOSIS — R262 Difficulty in walking, not elsewhere classified: Secondary | ICD-10-CM | POA: Diagnosis not present

## 2021-12-09 DIAGNOSIS — R2681 Unsteadiness on feet: Secondary | ICD-10-CM | POA: Diagnosis not present

## 2021-12-09 DIAGNOSIS — M6281 Muscle weakness (generalized): Secondary | ICD-10-CM | POA: Diagnosis not present

## 2021-12-09 DIAGNOSIS — M1380 Other specified arthritis, unspecified site: Secondary | ICD-10-CM | POA: Diagnosis not present

## 2021-12-09 DIAGNOSIS — R262 Difficulty in walking, not elsewhere classified: Secondary | ICD-10-CM | POA: Diagnosis not present

## 2021-12-09 DIAGNOSIS — G3184 Mild cognitive impairment, so stated: Secondary | ICD-10-CM | POA: Diagnosis not present

## 2021-12-09 DIAGNOSIS — R293 Abnormal posture: Secondary | ICD-10-CM | POA: Diagnosis not present

## 2021-12-11 DIAGNOSIS — R262 Difficulty in walking, not elsewhere classified: Secondary | ICD-10-CM | POA: Diagnosis not present

## 2021-12-11 DIAGNOSIS — M1380 Other specified arthritis, unspecified site: Secondary | ICD-10-CM | POA: Diagnosis not present

## 2021-12-11 DIAGNOSIS — G3184 Mild cognitive impairment, so stated: Secondary | ICD-10-CM | POA: Diagnosis not present

## 2021-12-11 DIAGNOSIS — R2681 Unsteadiness on feet: Secondary | ICD-10-CM | POA: Diagnosis not present

## 2021-12-11 DIAGNOSIS — M6281 Muscle weakness (generalized): Secondary | ICD-10-CM | POA: Diagnosis not present

## 2021-12-11 DIAGNOSIS — R293 Abnormal posture: Secondary | ICD-10-CM | POA: Diagnosis not present

## 2021-12-12 DIAGNOSIS — Z20822 Contact with and (suspected) exposure to covid-19: Secondary | ICD-10-CM | POA: Diagnosis not present

## 2021-12-16 DIAGNOSIS — R293 Abnormal posture: Secondary | ICD-10-CM | POA: Diagnosis not present

## 2021-12-16 DIAGNOSIS — R262 Difficulty in walking, not elsewhere classified: Secondary | ICD-10-CM | POA: Diagnosis not present

## 2021-12-16 DIAGNOSIS — M6281 Muscle weakness (generalized): Secondary | ICD-10-CM | POA: Diagnosis not present

## 2021-12-16 DIAGNOSIS — R2681 Unsteadiness on feet: Secondary | ICD-10-CM | POA: Diagnosis not present

## 2021-12-16 DIAGNOSIS — G3184 Mild cognitive impairment, so stated: Secondary | ICD-10-CM | POA: Diagnosis not present

## 2021-12-16 DIAGNOSIS — M1380 Other specified arthritis, unspecified site: Secondary | ICD-10-CM | POA: Diagnosis not present

## 2021-12-17 DIAGNOSIS — Z20822 Contact with and (suspected) exposure to covid-19: Secondary | ICD-10-CM | POA: Diagnosis not present

## 2021-12-18 DIAGNOSIS — M2011 Hallux valgus (acquired), right foot: Secondary | ICD-10-CM | POA: Diagnosis not present

## 2021-12-18 DIAGNOSIS — M79674 Pain in right toe(s): Secondary | ICD-10-CM | POA: Diagnosis not present

## 2021-12-18 DIAGNOSIS — R0989 Other specified symptoms and signs involving the circulatory and respiratory systems: Secondary | ICD-10-CM | POA: Diagnosis not present

## 2021-12-18 DIAGNOSIS — M79675 Pain in left toe(s): Secondary | ICD-10-CM | POA: Diagnosis not present

## 2021-12-18 DIAGNOSIS — Z7901 Long term (current) use of anticoagulants: Secondary | ICD-10-CM | POA: Diagnosis not present

## 2021-12-18 DIAGNOSIS — M2012 Hallux valgus (acquired), left foot: Secondary | ICD-10-CM | POA: Diagnosis not present

## 2021-12-18 DIAGNOSIS — B351 Tinea unguium: Secondary | ICD-10-CM | POA: Diagnosis not present

## 2021-12-18 DIAGNOSIS — R600 Localized edema: Secondary | ICD-10-CM | POA: Diagnosis not present

## 2021-12-19 DIAGNOSIS — M6281 Muscle weakness (generalized): Secondary | ICD-10-CM | POA: Diagnosis not present

## 2021-12-19 DIAGNOSIS — Z20822 Contact with and (suspected) exposure to covid-19: Secondary | ICD-10-CM | POA: Diagnosis not present

## 2021-12-19 DIAGNOSIS — G3184 Mild cognitive impairment, so stated: Secondary | ICD-10-CM | POA: Diagnosis not present

## 2021-12-19 DIAGNOSIS — R2681 Unsteadiness on feet: Secondary | ICD-10-CM | POA: Diagnosis not present

## 2021-12-19 DIAGNOSIS — M1380 Other specified arthritis, unspecified site: Secondary | ICD-10-CM | POA: Diagnosis not present

## 2021-12-19 DIAGNOSIS — R262 Difficulty in walking, not elsewhere classified: Secondary | ICD-10-CM | POA: Diagnosis not present

## 2021-12-19 DIAGNOSIS — R293 Abnormal posture: Secondary | ICD-10-CM | POA: Diagnosis not present

## 2021-12-23 DIAGNOSIS — G3184 Mild cognitive impairment, so stated: Secondary | ICD-10-CM | POA: Diagnosis not present

## 2021-12-23 DIAGNOSIS — R2681 Unsteadiness on feet: Secondary | ICD-10-CM | POA: Diagnosis not present

## 2021-12-23 DIAGNOSIS — M6281 Muscle weakness (generalized): Secondary | ICD-10-CM | POA: Diagnosis not present

## 2021-12-23 DIAGNOSIS — R262 Difficulty in walking, not elsewhere classified: Secondary | ICD-10-CM | POA: Diagnosis not present

## 2021-12-23 DIAGNOSIS — R293 Abnormal posture: Secondary | ICD-10-CM | POA: Diagnosis not present

## 2021-12-23 DIAGNOSIS — M1380 Other specified arthritis, unspecified site: Secondary | ICD-10-CM | POA: Diagnosis not present

## 2021-12-24 DIAGNOSIS — M1380 Other specified arthritis, unspecified site: Secondary | ICD-10-CM | POA: Diagnosis not present

## 2021-12-24 DIAGNOSIS — G3184 Mild cognitive impairment, so stated: Secondary | ICD-10-CM | POA: Diagnosis not present

## 2021-12-24 DIAGNOSIS — R293 Abnormal posture: Secondary | ICD-10-CM | POA: Diagnosis not present

## 2021-12-24 DIAGNOSIS — Z20822 Contact with and (suspected) exposure to covid-19: Secondary | ICD-10-CM | POA: Diagnosis not present

## 2021-12-24 DIAGNOSIS — M6281 Muscle weakness (generalized): Secondary | ICD-10-CM | POA: Diagnosis not present

## 2021-12-24 DIAGNOSIS — Z471 Aftercare following joint replacement surgery: Secondary | ICD-10-CM | POA: Diagnosis not present

## 2021-12-24 DIAGNOSIS — R2681 Unsteadiness on feet: Secondary | ICD-10-CM | POA: Diagnosis not present

## 2021-12-24 DIAGNOSIS — R262 Difficulty in walking, not elsewhere classified: Secondary | ICD-10-CM | POA: Diagnosis not present

## 2021-12-25 DIAGNOSIS — R293 Abnormal posture: Secondary | ICD-10-CM | POA: Diagnosis not present

## 2021-12-25 DIAGNOSIS — R262 Difficulty in walking, not elsewhere classified: Secondary | ICD-10-CM | POA: Diagnosis not present

## 2021-12-25 DIAGNOSIS — G3184 Mild cognitive impairment, so stated: Secondary | ICD-10-CM | POA: Diagnosis not present

## 2021-12-25 DIAGNOSIS — M6281 Muscle weakness (generalized): Secondary | ICD-10-CM | POA: Diagnosis not present

## 2021-12-25 DIAGNOSIS — R2681 Unsteadiness on feet: Secondary | ICD-10-CM | POA: Diagnosis not present

## 2021-12-25 DIAGNOSIS — M1380 Other specified arthritis, unspecified site: Secondary | ICD-10-CM | POA: Diagnosis not present

## 2021-12-31 DIAGNOSIS — R2681 Unsteadiness on feet: Secondary | ICD-10-CM | POA: Diagnosis not present

## 2021-12-31 DIAGNOSIS — M1380 Other specified arthritis, unspecified site: Secondary | ICD-10-CM | POA: Diagnosis not present

## 2021-12-31 DIAGNOSIS — R293 Abnormal posture: Secondary | ICD-10-CM | POA: Diagnosis not present

## 2021-12-31 DIAGNOSIS — R262 Difficulty in walking, not elsewhere classified: Secondary | ICD-10-CM | POA: Diagnosis not present

## 2021-12-31 DIAGNOSIS — G3184 Mild cognitive impairment, so stated: Secondary | ICD-10-CM | POA: Diagnosis not present

## 2021-12-31 DIAGNOSIS — M6281 Muscle weakness (generalized): Secondary | ICD-10-CM | POA: Diagnosis not present

## 2021-12-31 DIAGNOSIS — Z20822 Contact with and (suspected) exposure to covid-19: Secondary | ICD-10-CM | POA: Diagnosis not present

## 2022-01-06 DIAGNOSIS — M6281 Muscle weakness (generalized): Secondary | ICD-10-CM | POA: Diagnosis not present

## 2022-01-06 DIAGNOSIS — R293 Abnormal posture: Secondary | ICD-10-CM | POA: Diagnosis not present

## 2022-01-06 DIAGNOSIS — R262 Difficulty in walking, not elsewhere classified: Secondary | ICD-10-CM | POA: Diagnosis not present

## 2022-01-06 DIAGNOSIS — M1380 Other specified arthritis, unspecified site: Secondary | ICD-10-CM | POA: Diagnosis not present

## 2022-01-06 DIAGNOSIS — R2681 Unsteadiness on feet: Secondary | ICD-10-CM | POA: Diagnosis not present

## 2022-01-06 DIAGNOSIS — G3184 Mild cognitive impairment, so stated: Secondary | ICD-10-CM | POA: Diagnosis not present

## 2022-01-07 DIAGNOSIS — Z20822 Contact with and (suspected) exposure to covid-19: Secondary | ICD-10-CM | POA: Diagnosis not present

## 2022-01-07 DIAGNOSIS — M1380 Other specified arthritis, unspecified site: Secondary | ICD-10-CM | POA: Diagnosis not present

## 2022-01-07 DIAGNOSIS — R293 Abnormal posture: Secondary | ICD-10-CM | POA: Diagnosis not present

## 2022-01-07 DIAGNOSIS — R2681 Unsteadiness on feet: Secondary | ICD-10-CM | POA: Diagnosis not present

## 2022-01-07 DIAGNOSIS — G3184 Mild cognitive impairment, so stated: Secondary | ICD-10-CM | POA: Diagnosis not present

## 2022-01-07 DIAGNOSIS — M6281 Muscle weakness (generalized): Secondary | ICD-10-CM | POA: Diagnosis not present

## 2022-01-07 DIAGNOSIS — R262 Difficulty in walking, not elsewhere classified: Secondary | ICD-10-CM | POA: Diagnosis not present

## 2022-01-09 DIAGNOSIS — Z20822 Contact with and (suspected) exposure to covid-19: Secondary | ICD-10-CM | POA: Diagnosis not present

## 2022-01-12 DIAGNOSIS — Z20822 Contact with and (suspected) exposure to covid-19: Secondary | ICD-10-CM | POA: Diagnosis not present

## 2022-01-19 DIAGNOSIS — Z20822 Contact with and (suspected) exposure to covid-19: Secondary | ICD-10-CM | POA: Diagnosis not present
